# Patient Record
Sex: Female | Born: 1945 | Race: White | Hispanic: No | Marital: Married | Smoking: Never smoker
Health system: Southern US, Community
[De-identification: ages and names within clinical notes are randomized; demographics above are authoritative.]

## PROBLEM LIST (undated history)

## (undated) DIAGNOSIS — E7800 Pure hypercholesterolemia, unspecified: Secondary | ICD-10-CM

## (undated) DIAGNOSIS — R011 Cardiac murmur, unspecified: Secondary | ICD-10-CM

## (undated) DIAGNOSIS — I4891 Unspecified atrial fibrillation: Secondary | ICD-10-CM

## (undated) DIAGNOSIS — E079 Disorder of thyroid, unspecified: Secondary | ICD-10-CM

## (undated) DIAGNOSIS — I499 Cardiac arrhythmia, unspecified: Secondary | ICD-10-CM

## (undated) DIAGNOSIS — C50919 Malignant neoplasm of unspecified site of unspecified female breast: Secondary | ICD-10-CM

## (undated) DIAGNOSIS — I10 Essential (primary) hypertension: Secondary | ICD-10-CM

## (undated) HISTORY — DX: Cardiac arrhythmia, unspecified: I49.9

## (undated) HISTORY — PX: BUNIONECTOMY: SHX129

## (undated) HISTORY — DX: Cardiac murmur, unspecified: R01.1

## (undated) HISTORY — PX: BREAST SURGERY: SHX581

## (undated) HISTORY — DX: Disorder of thyroid, unspecified: E07.9

## (undated) HISTORY — PX: BACK SURGERY: SHX140

## (undated) HISTORY — PX: FRACTURE SURGERY: SHX138

---

## 2021-03-31 DIAGNOSIS — I350 Nonrheumatic aortic (valve) stenosis: Secondary | ICD-10-CM | POA: Diagnosis not present

## 2021-03-31 DIAGNOSIS — E039 Hypothyroidism, unspecified: Secondary | ICD-10-CM | POA: Diagnosis not present

## 2021-03-31 DIAGNOSIS — E038 Other specified hypothyroidism: Secondary | ICD-10-CM | POA: Diagnosis not present

## 2021-03-31 DIAGNOSIS — Z23 Encounter for immunization: Secondary | ICD-10-CM | POA: Diagnosis not present

## 2021-03-31 DIAGNOSIS — I10 Essential (primary) hypertension: Secondary | ICD-10-CM | POA: Diagnosis not present

## 2021-03-31 DIAGNOSIS — E785 Hyperlipidemia, unspecified: Secondary | ICD-10-CM | POA: Diagnosis not present

## 2021-03-31 DIAGNOSIS — E063 Autoimmune thyroiditis: Secondary | ICD-10-CM | POA: Diagnosis not present

## 2021-03-31 DIAGNOSIS — K635 Polyp of colon: Secondary | ICD-10-CM | POA: Diagnosis not present

## 2021-03-31 DIAGNOSIS — E7800 Pure hypercholesterolemia, unspecified: Secondary | ICD-10-CM | POA: Diagnosis not present

## 2021-03-31 DIAGNOSIS — Z0000 Encounter for general adult medical examination without abnormal findings: Secondary | ICD-10-CM | POA: Diagnosis not present

## 2021-03-31 DIAGNOSIS — Z79899 Other long term (current) drug therapy: Secondary | ICD-10-CM | POA: Diagnosis not present

## 2021-03-31 DIAGNOSIS — C50911 Malignant neoplasm of unspecified site of right female breast: Secondary | ICD-10-CM | POA: Diagnosis not present

## 2021-05-08 DIAGNOSIS — R590 Localized enlarged lymph nodes: Secondary | ICD-10-CM | POA: Diagnosis not present

## 2021-06-17 DIAGNOSIS — I10 Essential (primary) hypertension: Secondary | ICD-10-CM | POA: Diagnosis not present

## 2021-06-17 DIAGNOSIS — C50412 Malignant neoplasm of upper-outer quadrant of left female breast: Secondary | ICD-10-CM | POA: Diagnosis not present

## 2021-06-17 DIAGNOSIS — Z170 Estrogen receptor positive status [ER+]: Secondary | ICD-10-CM | POA: Diagnosis not present

## 2021-07-06 DIAGNOSIS — Z7689 Persons encountering health services in other specified circumstances: Secondary | ICD-10-CM | POA: Diagnosis not present

## 2021-07-06 DIAGNOSIS — Z780 Asymptomatic menopausal state: Secondary | ICD-10-CM | POA: Diagnosis not present

## 2021-07-06 DIAGNOSIS — I10 Essential (primary) hypertension: Secondary | ICD-10-CM | POA: Diagnosis not present

## 2021-07-06 DIAGNOSIS — Z86000 Personal history of in-situ neoplasm of breast: Secondary | ICD-10-CM | POA: Diagnosis not present

## 2021-07-06 DIAGNOSIS — I350 Nonrheumatic aortic (valve) stenosis: Secondary | ICD-10-CM | POA: Diagnosis not present

## 2021-07-06 DIAGNOSIS — E039 Hypothyroidism, unspecified: Secondary | ICD-10-CM | POA: Diagnosis not present

## 2021-07-06 DIAGNOSIS — Z0000 Encounter for general adult medical examination without abnormal findings: Secondary | ICD-10-CM | POA: Diagnosis not present

## 2021-08-15 ENCOUNTER — Emergency Department
Admission: EM | Admit: 2021-08-15 | Discharge: 2021-08-15 | Disposition: A | Discharge status: Home / Self Care | Payer: Medicare HMO | Attending: Emergency Medicine | Admitting: Emergency Medicine

## 2021-08-15 ENCOUNTER — Encounter: Payer: Self-pay | Admitting: Intensive Care

## 2021-08-15 ENCOUNTER — Other Ambulatory Visit: Payer: Self-pay

## 2021-08-15 ENCOUNTER — Emergency Department: Payer: Medicare HMO

## 2021-08-15 DIAGNOSIS — M7989 Other specified soft tissue disorders: Secondary | ICD-10-CM | POA: Diagnosis not present

## 2021-08-15 DIAGNOSIS — W19XXXA Unspecified fall, initial encounter: Secondary | ICD-10-CM | POA: Insufficient documentation

## 2021-08-15 DIAGNOSIS — S6992XA Unspecified injury of left wrist, hand and finger(s), initial encounter: Secondary | ICD-10-CM | POA: Diagnosis present

## 2021-08-15 DIAGNOSIS — M25561 Pain in right knee: Secondary | ICD-10-CM | POA: Insufficient documentation

## 2021-08-15 DIAGNOSIS — S63502A Unspecified sprain of left wrist, initial encounter: Secondary | ICD-10-CM | POA: Diagnosis not present

## 2021-08-15 DIAGNOSIS — M1711 Unilateral primary osteoarthritis, right knee: Secondary | ICD-10-CM | POA: Diagnosis not present

## 2021-08-15 DIAGNOSIS — S8991XA Unspecified injury of right lower leg, initial encounter: Secondary | ICD-10-CM | POA: Diagnosis not present

## 2021-08-15 DIAGNOSIS — I10 Essential (primary) hypertension: Secondary | ICD-10-CM | POA: Insufficient documentation

## 2021-08-15 HISTORY — DX: Essential (primary) hypertension: I10

## 2021-08-15 HISTORY — DX: Malignant neoplasm of unspecified site of unspecified female breast: C50.919

## 2021-08-15 HISTORY — DX: Pure hypercholesterolemia, unspecified: E78.00

## 2021-08-15 MED ORDER — ACETAMINOPHEN 500 MG PO TABS
1000.0000 mg | ORAL_TABLET | Freq: Once | ORAL | Status: AC
  Administered 2021-08-15: 1000 mg via ORAL
  Filled 2021-08-15: qty 2

## 2021-08-15 MED ORDER — NAPROXEN 500 MG PO TABS
500.0000 mg | ORAL_TABLET | Freq: Once | ORAL | Status: AC
  Administered 2021-08-15: 500 mg via ORAL
  Filled 2021-08-15: qty 1

## 2021-08-15 NOTE — Discharge Instructions (Signed)
Use Tylenol for pain and fevers.  Up to 1000 mg per dose, up to 4 times per day.  Do not take more than 4000 mg of Tylenol/acetaminophen within 24 hours..  Use naproxen/Aleve sparingly.  Ice, elevation and wrist splint

## 2021-08-15 NOTE — ED Provider Notes (Signed)
[facility] Emergency Department Provider Note ____________________________________________   Event Date/Time   First MD Initiated Contact with Patient 08/15/21 1413     (approximate)  I have reviewed the triage vital signs and the nursing notes.  HISTORY  Chief Complaint Wrist Pain, Knee Pain, and Fall   HPI Leslie Lewis is a 75 y.o. femalewho presents to the ED for evaluation of fall, left wrist pain.   Chart review indicates minimal medical history. She is independently ambulatory and lives at home with her husband.  Patient presents to the ED, accompanied by her husband, for evaluation of left wrist pain and right knee pain after a mechanical fall that occurred earlier today.  She reports accidentally getting her foot caught and her purse while exiting the car today, causing her to fall forward and strike her right knee and [place] on her left hand/wrist.  No head trauma or syncope.  She has been ambulatory since the event.  She was able to get back up and ambulate, and initially went home before returning to the ED due to increasing pain, primarily to her left wrist.  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  Past Medical History:  Diagnosis Date   Breast cancer ([place])    High cholesterol    Hypertension     There are no problems to display for this patient.   Past Surgical History:  Procedure Laterality Date   BREAST SURGERY      Prior to Admission medications   Not on File    Allergies Patient has no known allergies.  History reviewed. No pertinent family history.  Social History Social History   Tobacco Use   Smoking status: Never   Smokeless tobacco: Never  Substance Use Topics   Alcohol use: Yes    Alcohol/week: 3.0 standard drinks    Types: 3 Glasses of wine per week   Drug use: Never    Review of Systems  Constitutional: No fever/chills Eyes: No visual changes. ENT:  No sore throat. Cardiovascular: Denies chest pain. Respiratory: Denies shortness of breath. Gastrointestinal: No abdominal pain.  No nausea, no vomiting.  No diarrhea.  No constipation. Genitourinary: Negative for dysuria. Musculoskeletal: Negative for back pain. Positive for mechanical fall causing left wrist and right knee pain. Skin: Negative for rash. Neurological: Negative for headaches, focal weakness or numbness.  ____________________________________________   PHYSICAL EXAM:  VITAL SIGNS: Vitals:   08/15/21 1359  BP: (!) 160/123  Pulse: 64  Resp: 18  Temp: 98 F (36.7 C)  SpO2: 96%     Constitutional: Alert and oriented. Well appearing and in no acute distress. Eyes: Conjunctivae are normal. PERRL. EOMI. Head: Atraumatic. Nose: No congestion/rhinnorhea. Mouth/Throat: Mucous membranes are moist.  Oropharynx non-erythematous. Neck: No stridor. No cervical spine tenderness to palpation. Cardiovascular: Normal rate, regular rhythm. Grossly normal heart sounds.  Good peripheral circulation. Respiratory: Normal respiratory effort.  No retractions. Lungs CTAB. Gastrointestinal: Soft , nondistended, nontender to palpation. No CVA tenderness. Musculoskeletal:  No joint effusions. Small superficial abrasion to the anterior right knee overlying the patella.  No bony step-offs.  Full active and passive ROM with minimal discomfort.  No evidence of discontinuity.  Right leg is distally neurovascularly intact. No signs of deformity to the left wrist and left hand is distally neurovascularly intact.  Mild tenderness to the ulnar aspect of the left wrist, and she reports pain with ulnar deviation.  Nontender distal radius laterally. Neurologic:  Normal speech and language. No gross focal neurologic deficits are appreciated. No gait instability noted. Cranial nerves II through XII intact 5/5 strength and sensation in all 4 extremities Skin:  Skin is warm, dry and intact. No rash  noted. Psychiatric: Mood and affect are normal. Speech and behavior are normal.  ____________________________________________   LABS (all labs ordered are listed, but only abnormal results are displayed)  Labs Reviewed - No data to display ____________________________________________  12 Lead EKG   ____________________________________________  RADIOLOGY  ED MD interpretation: Plain films of the left wrist and right knee reviewed by me without evidence of acute fracture or dislocation  Official radiology report(s): DG Wrist Complete Left  Result Date: 08/15/2021 CLINICAL DATA:  Trauma, pain EXAM: LEFT WRIST - COMPLETE 3+ VIEW COMPARISON:  None. FINDINGS: No recent displaced fracture or dislocation is seen. There is minimal cortical irregularity in the lateral margin of distal radius without definite break in the cortical margins. There is soft tissue swelling over the dorsum of the wrist. Degenerative changes are noted with bony spurs in the first carpometacarpal joint. Small bony spurs seen in the intercarpal joints along the lateral aspect of the wrist. IMPRESSION: No recent displaced fracture or dislocation is seen. Minimal cortical irregularity in the lateral margin of distal radius may be residual from previous injury. Degenerative changes are noted with bony spurs in first carpometacarpal joint and the intercarpal joint along the lateral aspect of the wrist. Electronically Signed   By: Elmer Picker M.D.   On: 08/15/2021 15:04   DG Knee Complete 4 Views Right  Result Date: 08/15/2021 CLINICAL DATA:  Trauma, pain EXAM: RIGHT KNEE - COMPLETE 4+ VIEW COMPARISON:  None. FINDINGS: No recent fracture or dislocation is seen. Small bony spurs seen in the patella and medial compartment. There is possible minimal effusion in the suprapatellar bursa. IMPRESSION: No recent fracture or dislocation is seen in the right knee. Possible minimal effusion is present in the suprapatellar bursa.  There are degenerative changes tiny bony spurs in the medial and patellofemoral compartments. Electronically Signed   By: Elmer Picker M.D.   On: 08/15/2021 15:06    ____________________________________________   PROCEDURES and INTERVENTIONS  Procedure(s) performed (including Critical Care):  Procedures  Medications  acetaminophen (TYLENOL) tablet 1,000 mg (1,000 mg Oral Given 08/15/21 1548)  naproxen (NAPROSYN) tablet 500 mg (500 mg Oral Given 08/15/21 1549)    ____________________________________________   MDM / ED COURSE   Pleasant 75 year old lady presents to the ED after mechanical fall with wrist pain, likely a strain/sprain and amenable to outpatient management.  She looks clinically well without evidence of neurologic or vascular deficits.  No signs of open injuries, laceration or bleeding injuries.  Abrasions to the right knee with low suspicion for fracture, and follow-up x-rays are without evidence of fracture or dislocation.  She has some tenderness with ulnar deviation of that left wrist, but no point bony tenderness.  X-ray questions cortical irregularity to the distal radius, but does not correlate clinically.  Anticipate soft tissue strain.  We discussed bracing at home, she reports having a splint from her remote de Quervain's tenosynovitis surgery, and return precautions for the ED.  Clinical Course as of 08/15/21 1549  Sat Aug 15, 2021  1520 Reassessed and reexamined.  We discussed x-ray results.  No tenderness to the distal radius and she is most tender with ulnar deviation of the wrist.  We discussed likely sprain/strain and management at home, and return precautions for the ED. [  DS]    Clinical Course User Index [DS] Vladimir Crofts, MD    ____________________________________________   FINAL CLINICAL IMPRESSION(S) / ED DIAGNOSES  Final diagnoses:  Fall, initial encounter  Sprain of left wrist, initial encounter     ED Discharge Orders     None         Surabhi Gadea Tamala Julian   Note:  This document was prepared using Dragon voice recognition software and may include unintentional dictation errors.    Vladimir Crofts, MD 08/15/21 [phone]

## 2021-08-15 NOTE — ED Triage Notes (Signed)
Patient tripped over purse getting out of car. C/o pain in right knee and left wrist

## 2021-08-15 NOTE — ED Notes (Signed)
Patient declined discharge vital signs. 

## 2022-09-08 ENCOUNTER — Encounter: Payer: Self-pay | Admitting: Emergency Medicine

## 2022-09-08 ENCOUNTER — Other Ambulatory Visit: Payer: Self-pay

## 2022-09-08 ENCOUNTER — Emergency Department: Payer: Medicare Other

## 2022-09-08 ENCOUNTER — Emergency Department
Admission: EM | Admit: 2022-09-08 | Discharge: 2022-09-08 | Disposition: A | Discharge status: Home / Self Care | Payer: Medicare Other | Attending: Emergency Medicine | Admitting: Emergency Medicine

## 2022-09-08 DIAGNOSIS — I4891 Unspecified atrial fibrillation: Secondary | ICD-10-CM | POA: Diagnosis not present

## 2022-09-08 DIAGNOSIS — Z853 Personal history of malignant neoplasm of breast: Secondary | ICD-10-CM | POA: Insufficient documentation

## 2022-09-08 DIAGNOSIS — R079 Chest pain, unspecified: Secondary | ICD-10-CM | POA: Diagnosis present

## 2022-09-08 DIAGNOSIS — R0609 Other forms of dyspnea: Secondary | ICD-10-CM | POA: Diagnosis not present

## 2022-09-08 DIAGNOSIS — I10 Essential (primary) hypertension: Secondary | ICD-10-CM | POA: Insufficient documentation

## 2022-09-08 LAB — CBC
HCT: 35.8 % — ABNORMAL LOW (ref 36.0–46.0)
Hemoglobin: 11.6 g/dL — ABNORMAL LOW (ref 12.0–15.0)
MCH: 30.5 pg (ref 26.0–34.0)
MCHC: 32.4 g/dL (ref 30.0–36.0)
MCV: 94.2 fL (ref 80.0–100.0)
Platelets: 152 10*3/uL (ref 150–400)
RBC: 3.8 MIL/uL — ABNORMAL LOW (ref 3.87–5.11)
RDW: 13.4 % (ref 11.5–15.5)
WBC: 5.6 10*3/uL (ref 4.0–10.5)
nRBC: 0 % (ref 0.0–0.2)

## 2022-09-08 LAB — BASIC METABOLIC PANEL
Anion gap: 9 (ref 5–15)
BUN: 20 mg/dL (ref 8–23)
CO2: 24 mmol/L (ref 22–32)
Calcium: 8.5 mg/dL — ABNORMAL LOW (ref 8.9–10.3)
Chloride: 108 mmol/L (ref 98–111)
Creatinine, Ser: 0.93 mg/dL (ref 0.44–1.00)
GFR, Estimated: >60 mL/min (ref >60)
Glucose, Bld: 100 mg/dL — ABNORMAL HIGH (ref 70–99)
Potassium: 3.9 mmol/L (ref 3.5–5.1)
Sodium: 141 mmol/L (ref 135–145)

## 2022-09-08 LAB — MAGNESIUM: Magnesium: 2.1 mg/dL (ref 1.7–2.4)

## 2022-09-08 LAB — TROPONIN I (HIGH SENSITIVITY): Troponin I (High Sensitivity): 7 ng/L (ref <18)

## 2022-09-08 LAB — TSH: TSH: 5.383 u[IU]/mL — ABNORMAL HIGH (ref 0.350–4.500)

## 2022-09-08 MED ORDER — METOPROLOL TARTRATE 25 MG PO TABS
25.0000 mg | ORAL_TABLET | Freq: Once | ORAL | Status: AC
  Administered 2022-09-08: 25 mg via ORAL
  Filled 2022-09-08: qty 1

## 2022-09-08 MED ORDER — METOPROLOL TARTRATE 25 MG PO TABS: 25.0000 mg | ORAL_TABLET | Freq: Two times a day (BID) | ORAL | 2 refills | Status: DC

## 2022-09-08 MED ORDER — APIXABAN 5 MG PO TABS: 5.0000 mg | ORAL_TABLET | Freq: Two times a day (BID) | ORAL | 0 refills | Status: AC

## 2022-09-08 NOTE — ED Provider Notes (Signed)
[facility] Provider Note    Event Date/Time   First MD Initiated Contact with Patient 09/08/22 1936     (approximate)   History   Chief Complaint Chest Pain   HPI  Bryleigh Ottaway is a 76 y.o. female with past medical history of hypertension, hyperlipidemia, and breast cancer who presents to the ED complaining of chest pain.  Patient ports that about 1 week ago she first began to feel like her heart was racing with some pressure in her chest and difficulty breathing.  This seemed to improve but remained present over the course of the past week, while she traveled to [place] to visit family.  She states while traveling home, she would get out of breath very easily and has felt very fatigued, describes ongoing chest pressure that has been milder than before.  She reports a mild difficulty breathing at rest, denies any fevers or cough.  She has not had any pain or swelling in her legs.  EMS notes atrial fibrillation with RVR, patient denies any history of this.     Physical Exam   Triage Vital Signs: ED Triage Vitals [09/08/22 1937]  Enc Vitals Group     BP      Pulse      Resp      Temp      Temp src      SpO2      Weight 232 lb (105.2 kg)     Height '5\' 8"'$  (1.727 m)     Head Circumference      Peak Flow      Pain Score      Pain Loc      Pain Edu?      Excl. in [place]?     Most recent vital signs: Vitals:   09/08/22 2000 09/08/22 2030  BP: (!) 144/83 (!) 144/84  Pulse: 83 84  Resp: 17 15  Temp:    SpO2: 95% 94%    Constitutional: Alert and oriented. Eyes: Conjunctivae are normal. Head: Atraumatic. Nose: No congestion/rhinnorhea. Mouth/Throat: Mucous membranes are moist.  Cardiovascular: Tachycardic, irregularly irregular rhythm. Grossly normal heart sounds.  2+ radial pulses bilaterally. Respiratory: Normal respiratory effort.  No retractions. Lungs CTAB. Gastrointestinal: Soft and nontender. No distention. Musculoskeletal: No lower  extremity tenderness nor edema.  Neurologic:  Normal speech and language. No gross focal neurologic deficits are appreciated.    ED Results / Procedures / Treatments   Labs (all labs ordered are listed, but only abnormal results are displayed) Labs Reviewed  BASIC METABOLIC PANEL - Abnormal; Notable for the following components:      Result Value   Glucose, Bld 100 (*)    Calcium 8.5 (*)    All other components within normal limits  CBC - Abnormal; Notable for the following components:   RBC 3.80 (*)    Hemoglobin 11.6 (*)    HCT 35.8 (*)    All other components within normal limits  TSH - Abnormal; Notable for the following components:   TSH 5.383 (*)    All other components within normal limits  MAGNESIUM  TROPONIN I (HIGH SENSITIVITY)  TROPONIN I (HIGH SENSITIVITY)     EKG  ED ECG REPORT I, Blake Divine, the attending physician, personally viewed and interpreted this ECG.   Date: 09/08/2022  EKG Time: 19:35  Rate: 69  Rhythm: atrial fibrillation  Axis: Normal  Intervals:none  ST&T Change: None  RADIOLOGY Chest x-ray reviewed and interpreted by me with  no infiltrate, edema, or effusion.  PROCEDURES:  Critical Care performed: No  Procedures   MEDICATIONS ORDERED IN ED: Medications  metoprolol tartrate (LOPRESSOR) tablet 25 mg (has no administration in time range)     IMPRESSION / MDM / ASSESSMENT AND PLAN / ED COURSE  I reviewed the triage vital signs and the nursing notes.                              76 y.o. female with past medical history of hypertension, hyperlipidemia, and breast cancer who presents to the ED complaining of palpitations, chest pressure, and shortness of breath primarily with exertion for the past week.  Patient's presentation is most consistent with acute presentation with potential threat to life or bodily function.  Differential diagnosis includes, but is not limited to, arrhythmia, ACS, PE, pneumonia, CHF, electrolyte  abnormality, AKI, anemia.  Patient nontoxic-appearing and in no acute distress, vital signs are unremarkable.  Patient noted to be in atrial fibrillation with EMS, who reports that heart rate got as high as 150 but now improved following 10 mg of IV diltiazem.  EKG here in the ED shows atrial fibrillation with controlled rate, no ischemic changes noted.  We will screen labs and chest x-ray, observe on cardiac monitor.  Labs are reassuring with no significant anemia, leukocytosis, stridor abnormality, or AKI.  Troponin within normal limits and I doubt ACS or PE.  Heart rate remains well-controlled here in the ED and patient asymptomatic on reassessment.  Case discussed with Dr. Clayborn Bigness of cardiology, who agrees with plan for discharge home with outpatient follow-up.  We will start patient on Eliquis given her elevated CHA2DS2-VASc score, will also start on 25 mg metoprolol twice daily.  She was counseled to return to the ED for new or worsening symptoms, patient agrees with plan.      FINAL CLINICAL IMPRESSION(S) / ED DIAGNOSES   Final diagnoses:  Atrial fibrillation, unspecified type ([place])  Dyspnea on exertion     Rx / DC Orders   ED Discharge Orders          Ordered    metoprolol tartrate (LOPRESSOR) 25 MG tablet  2 times daily        09/08/22 2116    apixaban (ELIQUIS) 5 MG TABS tablet  2 times daily        09/08/22 2116             Note:  This document was prepared using Dragon voice recognition software and may include unintentional dictation errors.   Blake Divine, MD 09/08/22 2117

## 2022-09-08 NOTE — ED Triage Notes (Signed)
Pt arrived via [facility] from home with c/o palpitations, central chest pressure and SOB x1 week. Pt alerted by smart watch that she was in AFib rhythm with no hx. Pt found to be in AFib RVR at a rate of 140-180 initial by EMS. Pt given '10mg'$  Cardizem. Post med, pt in Afib without RVR at a rate of 100-125bpm.

## 2022-09-08 NOTE — ED Notes (Signed)
ED Provider at bedside. 

## 2022-10-19 ENCOUNTER — Ambulatory Visit: Payer: Medicare Other | Admitting: Student in an Organized Health Care Education/Training Program

## 2023-04-05 ENCOUNTER — Other Ambulatory Visit: Payer: Self-pay | Admitting: Orthopedic Surgery

## 2023-04-05 DIAGNOSIS — M84461A Pathological fracture, right tibia, initial encounter for fracture: Secondary | ICD-10-CM

## 2023-04-05 DIAGNOSIS — M25561 Pain in right knee: Secondary | ICD-10-CM

## 2023-04-07 ENCOUNTER — Ambulatory Visit
Admission: RE | Admit: 2023-04-07 | Discharge: 2023-04-07 | Disposition: A | Discharge status: Home / Self Care | Payer: Medicare Other | Source: Ambulatory Visit | Attending: Orthopedic Surgery | Admitting: Orthopedic Surgery

## 2023-04-07 DIAGNOSIS — M25561 Pain in right knee: Secondary | ICD-10-CM | POA: Insufficient documentation

## 2023-04-07 DIAGNOSIS — M84461A Pathological fracture, right tibia, initial encounter for fracture: Secondary | ICD-10-CM | POA: Diagnosis present

## 2023-06-22 ENCOUNTER — Emergency Department: Payer: Medicare Other

## 2023-06-22 ENCOUNTER — Emergency Department
Admission: EM | Admit: 2023-06-22 | Discharge: 2023-06-22 | Disposition: A | Discharge status: Home / Self Care | Payer: Medicare Other | Attending: Emergency Medicine | Admitting: Emergency Medicine

## 2023-06-22 ENCOUNTER — Other Ambulatory Visit: Payer: Self-pay

## 2023-06-22 DIAGNOSIS — H81399 Other peripheral vertigo, unspecified ear: Secondary | ICD-10-CM | POA: Diagnosis not present

## 2023-06-22 DIAGNOSIS — R42 Dizziness and giddiness: Secondary | ICD-10-CM | POA: Diagnosis present

## 2023-06-22 DIAGNOSIS — I10 Essential (primary) hypertension: Secondary | ICD-10-CM | POA: Insufficient documentation

## 2023-06-22 LAB — BASIC METABOLIC PANEL
Anion gap: 10 (ref 5–15)
BUN: 24 mg/dL — ABNORMAL HIGH (ref 8–23)
CO2: 26 mmol/L (ref 22–32)
Calcium: 9 mg/dL (ref 8.9–10.3)
Chloride: 97 mmol/L — ABNORMAL LOW (ref 98–111)
Creatinine, Ser: 0.91 mg/dL (ref 0.44–1.00)
GFR, Estimated: >60 mL/min (ref >60)
Glucose, Bld: 178 mg/dL — ABNORMAL HIGH (ref 70–99)
Potassium: 4.1 mmol/L (ref 3.5–5.1)
Sodium: 133 mmol/L — ABNORMAL LOW (ref 135–145)

## 2023-06-22 LAB — CBC
HCT: 35.9 % — ABNORMAL LOW (ref 36.0–46.0)
Hemoglobin: 12.1 g/dL (ref 12.0–15.0)
MCH: 30.9 pg (ref 26.0–34.0)
MCHC: 33.7 g/dL (ref 30.0–36.0)
MCV: 91.6 fL (ref 80.0–100.0)
Platelets: 176 10*3/uL (ref 150–400)
RBC: 3.92 MIL/uL (ref 3.87–5.11)
RDW: 13 % (ref 11.5–15.5)
WBC: 5.5 10*3/uL (ref 4.0–10.5)
nRBC: 0 % (ref 0.0–0.2)

## 2023-06-22 MED ORDER — ONDANSETRON 4 MG PO TBDP: 4.0000 mg | ORAL_TABLET | Freq: Four times a day (QID) | ORAL | 0 refills | Status: DC | PRN

## 2023-06-22 MED ORDER — MECLIZINE HCL 12.5 MG PO TABS: 12.5000 mg | ORAL_TABLET | Freq: Three times a day (TID) | ORAL | 1 refills | Status: AC | PRN

## 2023-06-22 MED ORDER — ONDANSETRON HCL 4 MG/2ML IJ SOLN
4.0000 mg | INTRAMUSCULAR | Status: AC
  Administered 2023-06-22: 4 mg via INTRAVENOUS
  Filled 2023-06-22: qty 2

## 2023-06-22 MED ORDER — LORAZEPAM 2 MG/ML IJ SOLN
0.5000 mg | Freq: Once | INTRAMUSCULAR | Status: AC
  Administered 2023-06-22: 0.5 mg via INTRAVENOUS
  Filled 2023-06-22: qty 1

## 2023-06-22 MED ORDER — MECLIZINE HCL 25 MG PO TABS
12.5000 mg | ORAL_TABLET | Freq: Once | ORAL | Status: AC
  Administered 2023-06-22: 12.5 mg via ORAL
  Filled 2023-06-22: qty 1

## 2023-06-22 NOTE — ED Provider Notes (Signed)
[facility] Provider Note    Event Date/Time   First MD Initiated Contact with Patient 06/22/23 [phone]     (approximate)   History   Dizziness and Nausea   HPI  Leslie Lewis is a 77 y.o. female with a history of hypertension and atrial fibrillation  Patient reports that she woke up at about 3 AM to use the bathroom.  When she got up she felt extremely dizzy.  It is improved if she sits still but developed severe dizziness nausea and vomiting if she moves or gets up too quickly.  She is supposed to go [place]  There is no headache no numbness no weakness.  She has vomited several times she reports she gets so dizzy feeling it makes her throw up.  No chest pain or trouble breathing.  Denies recent illness.  She has not had the symptoms before     Physical Exam   Triage Vital Signs: ED Triage Vitals  Encounter Vitals Group     BP 06/22/23 0756 (!) 135/51     Systolic BP Percentile --      Diastolic BP Percentile --      Pulse Rate 06/22/23 0756 74     Resp 06/22/23 0756 18     Temp 06/22/23 0756 97.6 F (36.4 C)     Temp Source 06/22/23 0756 Oral     SpO2 06/22/23 0756 95 %     Weight 06/22/23 0800 234 lb (106.1 kg)     Height 06/22/23 0800 5\' 8"  (1.727 m)     Head Circumference --      Peak Flow --      Pain Score 06/22/23 0800 0     Pain Loc --      Pain Education --      Exclude from Growth Chart --     Most recent vital signs: Vitals:   06/22/23 0756 06/22/23 0830  BP: (!) 135/51   Pulse: 74 65  Resp: 18 20  Temp: 97.6 F (36.4 C)   SpO2: 95% 94%     General: Awake, no distress.  CV:  Good peripheral perfusion.  Normal tones and rate, Resp:  Normal effort.  Clear bilateral Abd:  No distention.  Soft nontender Other:  Normocephalic atraumatic.  Moist mucous membranes.  Normal extraocular movements without nystagmus while at rest.  She demonstrates normal strength in all  extremities no motor deficits.  Cranial nerve exam normal.   ED Results / Procedures / Treatments   Labs (all labs ordered are listed, but only abnormal results are displayed) Labs Reviewed  CBC - Abnormal; Notable for the following components:      Result Value   HCT 35.9 (*)    All other components within normal limits  BASIC METABOLIC PANEL - Abnormal; Notable for the following components:   Sodium 133 (*)    Chloride 97 (*)    Glucose, Bld 178 (*)    BUN 24 (*)    All other components within normal limits     EKG  And interpreted by me at 810 heart rate 65 QRS 110 QTc 440 Normal sinus rhythm no evidence ischemia   RADIOLOGY  MR BRAIN WO CONTRAST  Result Date: 06/22/2023 CLINICAL DATA:  77 year old female with acute onset dizziness at 0300 hours. Atrial fibrillation. Nausea vomiting. EXAM: MRI HEAD WITHOUT CONTRAST TECHNIQUE: Multiplanar, multiecho pulse sequences of the brain and  surrounding structures were obtained without intravenous contrast. COMPARISON:  None Available. FINDINGS: Brain: Cerebral volume is within normal limits for age. No restricted diffusion to suggest acute infarction. No midline shift, mass effect, evidence of mass lesion, ventriculomegaly, extra-axial collection or acute intracranial hemorrhage. Cervicomedullary junction and pituitary are within normal limits. Widely scattered, generally small but sometimes patchy bilateral white matter T2 and FLAIR hyperintensity. Configuration is nonspecific. No cortical encephalomalacia identified. Rare chronic microhemorrhage in the brain (right anterior frontal lobe series 10, image 40). Deep gray nuclei, brainstem and cerebellum appear normal for age. Vascular: Major intracranial vascular flow voids are preserved. Some generalized intracranial artery tortuosity. Skull and upper cervical spine: Normal for age visible cervical spine. Visualized bone marrow signal is within normal limits. Sinuses/Orbits: Postoperative  changes to both globes, otherwise negative orbits. Moderate sphenoid sinus mucosal thickening. Minor paranasal sinus mucosal thickening elsewhere. Other: Mastoids are clear. Visible internal auditory structures appear normal. Stylomastoid foramina, visible scalp and face appear negative. IMPRESSION: 1. No acute intracranial abnormality. 2. Advanced but nonspecific cerebral white matter signal changes, most commonly due to chronic small vessel disease. Otherwise negative noncontrast MRI appearance of the brain. 3. Sphenoid sinusitis with no complicating features. Electronically Signed   By: Odessa Fleming M.D.   On: 06/22/2023 09:52      PROCEDURES:  Critical Care performed: No  Procedures   MEDICATIONS ORDERED IN ED: Medications  ondansetron (ZOFRAN) injection 4 mg (4 mg Intravenous Given 06/22/23 0820)  meclizine (ANTIVERT) tablet 12.5 mg (12.5 mg Oral Given 06/22/23 0821)  LORazepam (ATIVAN) injection 0.5 mg (0.5 mg Intravenous Given 06/22/23 0821)     IMPRESSION / MDM / ASSESSMENT AND PLAN / ED COURSE  I reviewed the triage vital signs and the nursing notes.                              Differential diagnosis includes, but is not limited to, possible vertigo, electrolyte abnormality, central cause such as stroke, etc.  He has no acute cardiopulmonary symptoms.  No syncopal symptoms or palpitations.  She denies any recent illness no fevers.  Vital signs reassuring  She appears to have examination consistent with severe vertiginous feeling with movement.  Alleviated by sitting still  She does have active dry heaving when moved between bed and stretcher.  This calms with holding her head still.  Symptoms are quite reproducible with movement  Patient's presentation is most consistent with acute complicated illness / injury requiring diagnostic workup.   The patient is on the cardiac monitor to evaluate for evidence of arrhythmia and/or significant heart rate  changes.   ----------------------------------------- 11:13 AM on 06/22/2023 ----------------------------------------- Patient fully alert.  Feeling much better.  Advises nausea and vomiting have improved and the dizziness seems to have calmed quite a bit.  She has a walker at the house that she can use, and is comfortable with a plan to try getting up with assistance here and if she is able to walk safely and steadily to go home with prescription for Zofran and Antivert.  She is presently alert well-oriented in no distress.  Symptoms appear much improved.  Suspect peripheral vertigo without evidence of central cause on MRI  No acute cardiopulmonary or vascular symptoms.  No neck pain.   ----------------------------------------- 11:48 AM on 06/22/2023 -----------------------------------------  Per rn Pt ambulated in hallway about 50' using walker. Pt in NAD, normal gait, no issues. Pt did say she may use husbands  walker at home, still feeling a little weak but much better than earlier. Dr. Fanny Bien to be informed.   Considered small IV fluid bolus but due to national shortage will withhold.  Patient able to tolerate by mouth now  Return precautions and treatment recommendations and follow-up discussed with the patient who is agreeable with the plan.   FINAL CLINICAL IMPRESSION(S) / ED DIAGNOSES   Final diagnoses:  Peripheral vertigo, unspecified laterality     Rx / DC Orders   ED Discharge Orders          Ordered    meclizine (ANTIVERT) 12.5 MG tablet  3 times daily PRN        06/22/23 1147    ondansetron (ZOFRAN-ODT) 4 MG disintegrating tablet  Every 6 hours PRN        06/22/23 1147             Note:  This document was prepared using Dragon voice recognition software and may include unintentional dictation errors.   Sharyn Creamer, MD 06/22/23 1148

## 2023-06-22 NOTE — ED Notes (Signed)
Pt ambulated in hallway about 50' using walker. Pt in NAD, normal gait, no issues. Pt did say she may use husbands walker at home, still feeling a little weak but much better than earlier. Dr. Fanny Bien to be informed.

## 2023-06-22 NOTE — Discharge Instructions (Addendum)
I recommend you do not drive until you are certain your symptoms have improved, as it could cause you to crash he suddenly got dizzy  Return to the ER right away if you develop difficulty speaking, weakness in arm or leg or your face, facial droop, severe headache fever neck pain feel you are going to pass out to have abdominal pain chest pain or other concerns arise

## 2023-06-22 NOTE — ED Triage Notes (Signed)
Pt to ED via [facility] from home for c/o n/v when moving and dizziness which began began at 3:00 am. Pt has hx Afib. Vital signs normal with EMS. Cbg 175.

## 2023-10-13 IMAGING — CR DG WRIST COMPLETE 3+V*L*
4 series · 4 of 4 positions shown · non-contrast
Comparison: None.

CLINICAL DATA: Trauma, pain

EXAM:
LEFT WRIST - COMPLETE 3+ VIEW

[wrist pa]
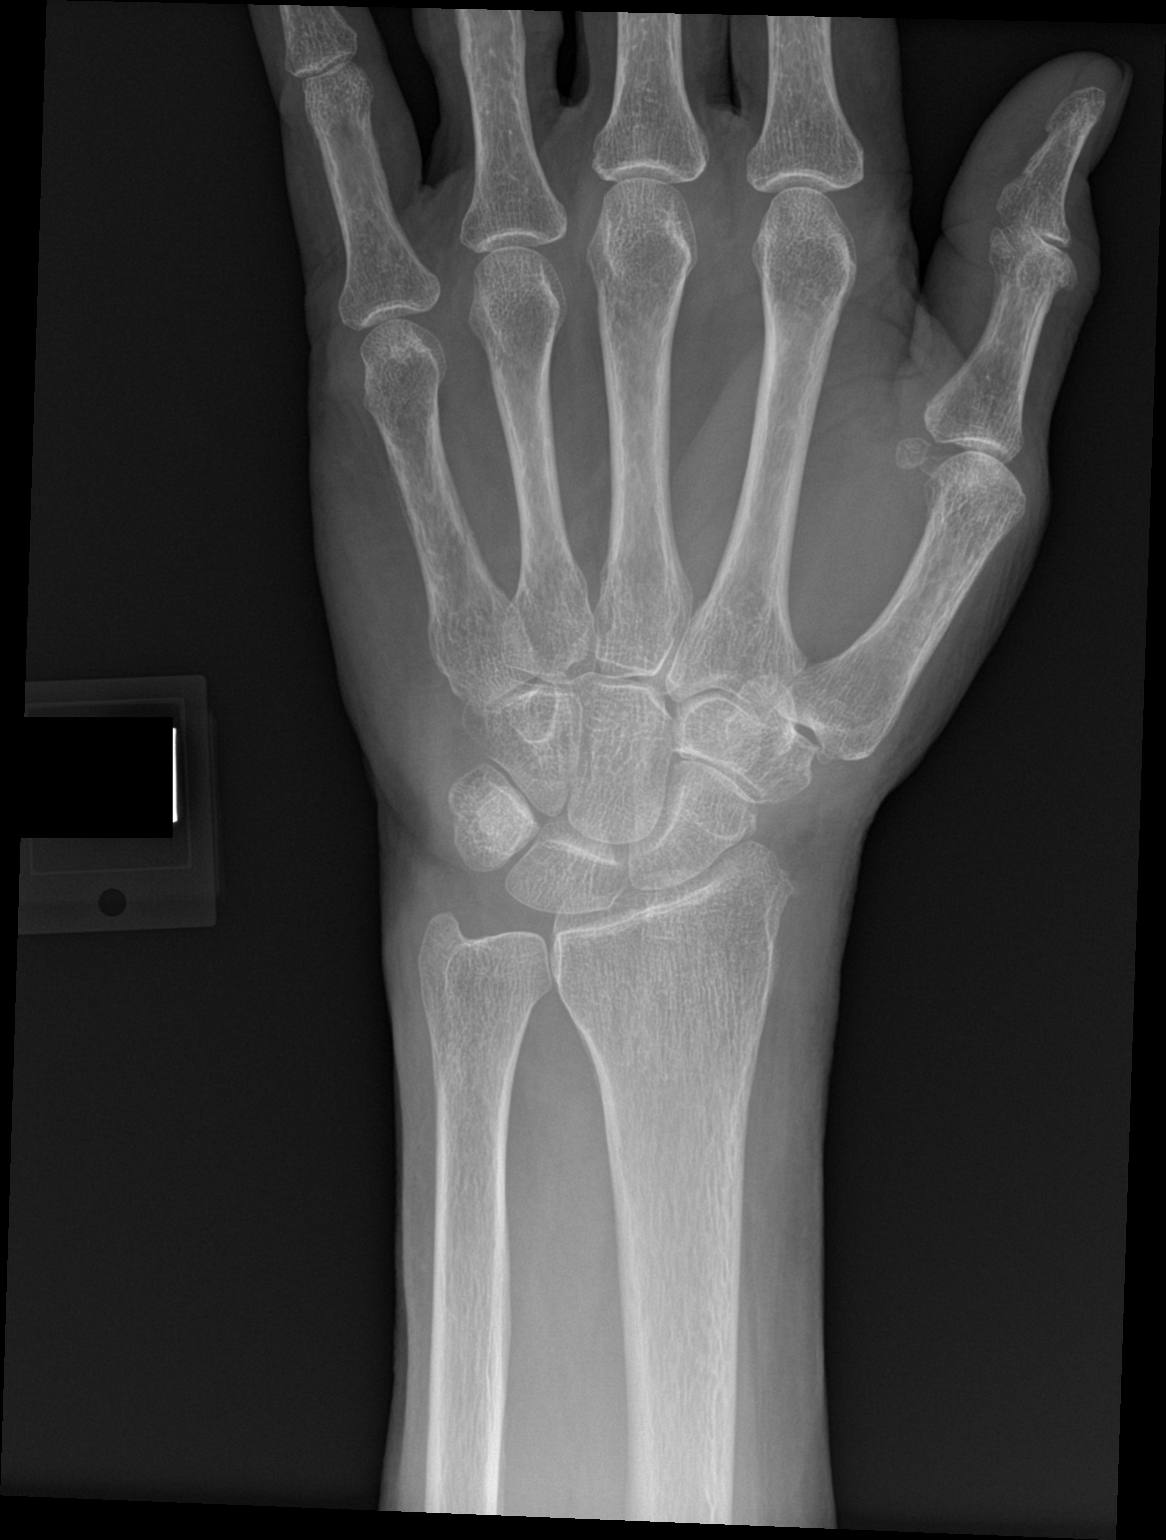

[wrist obl]
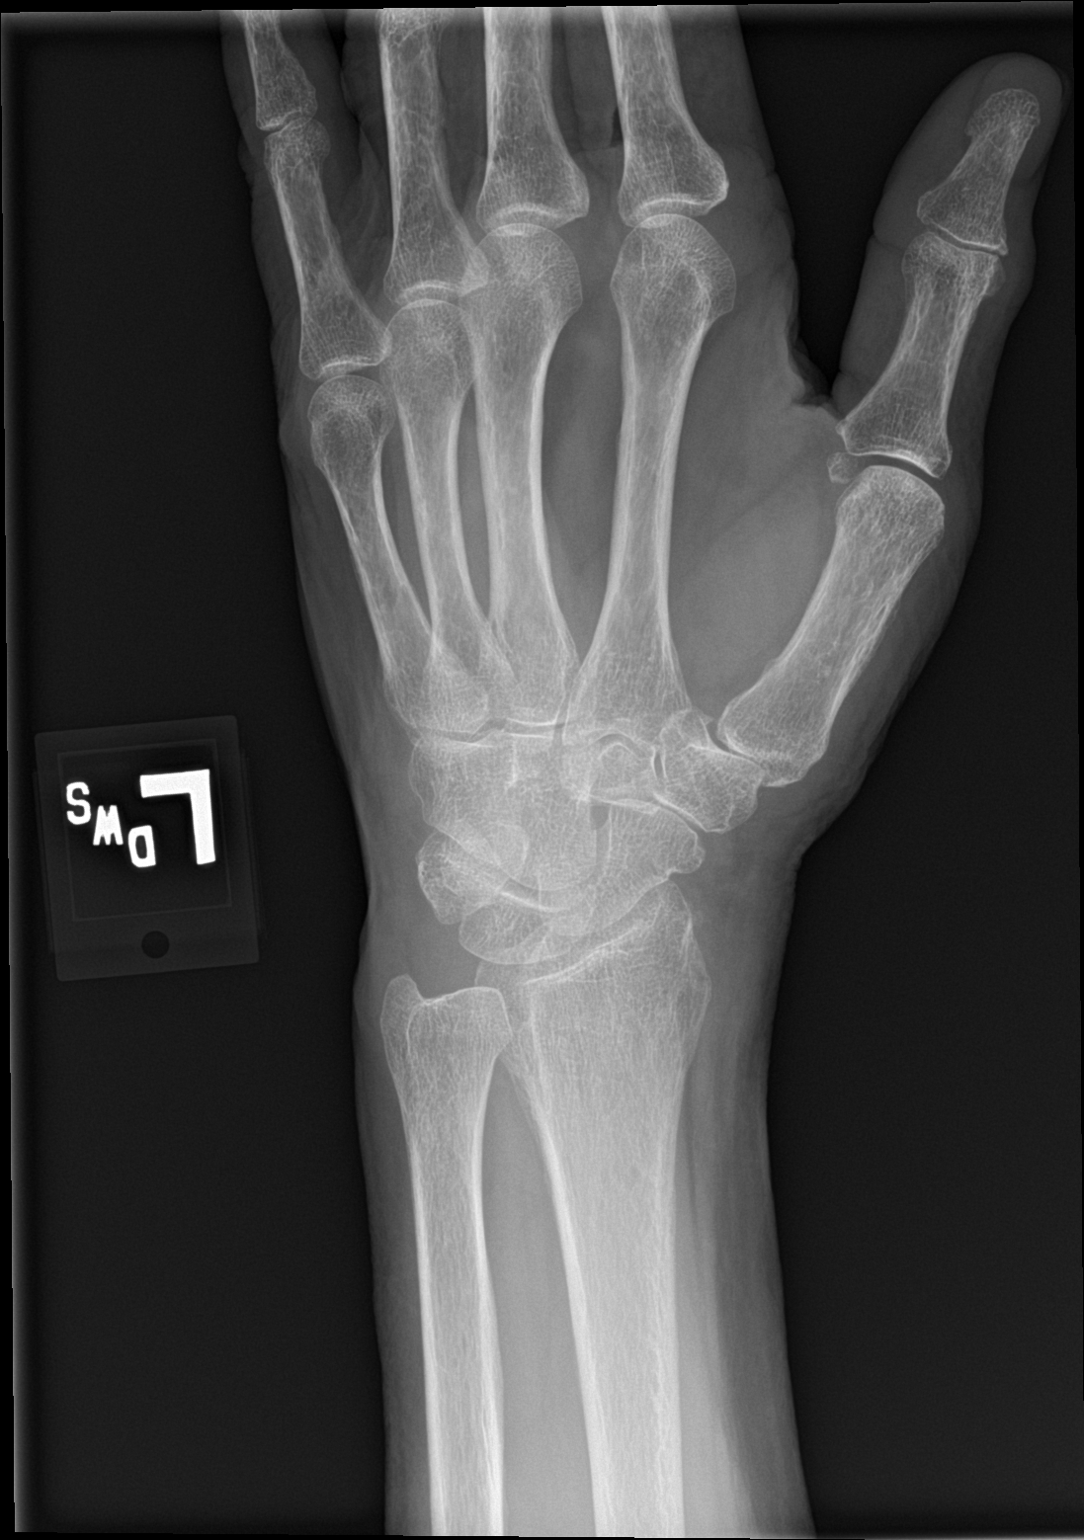

[wrist lat]
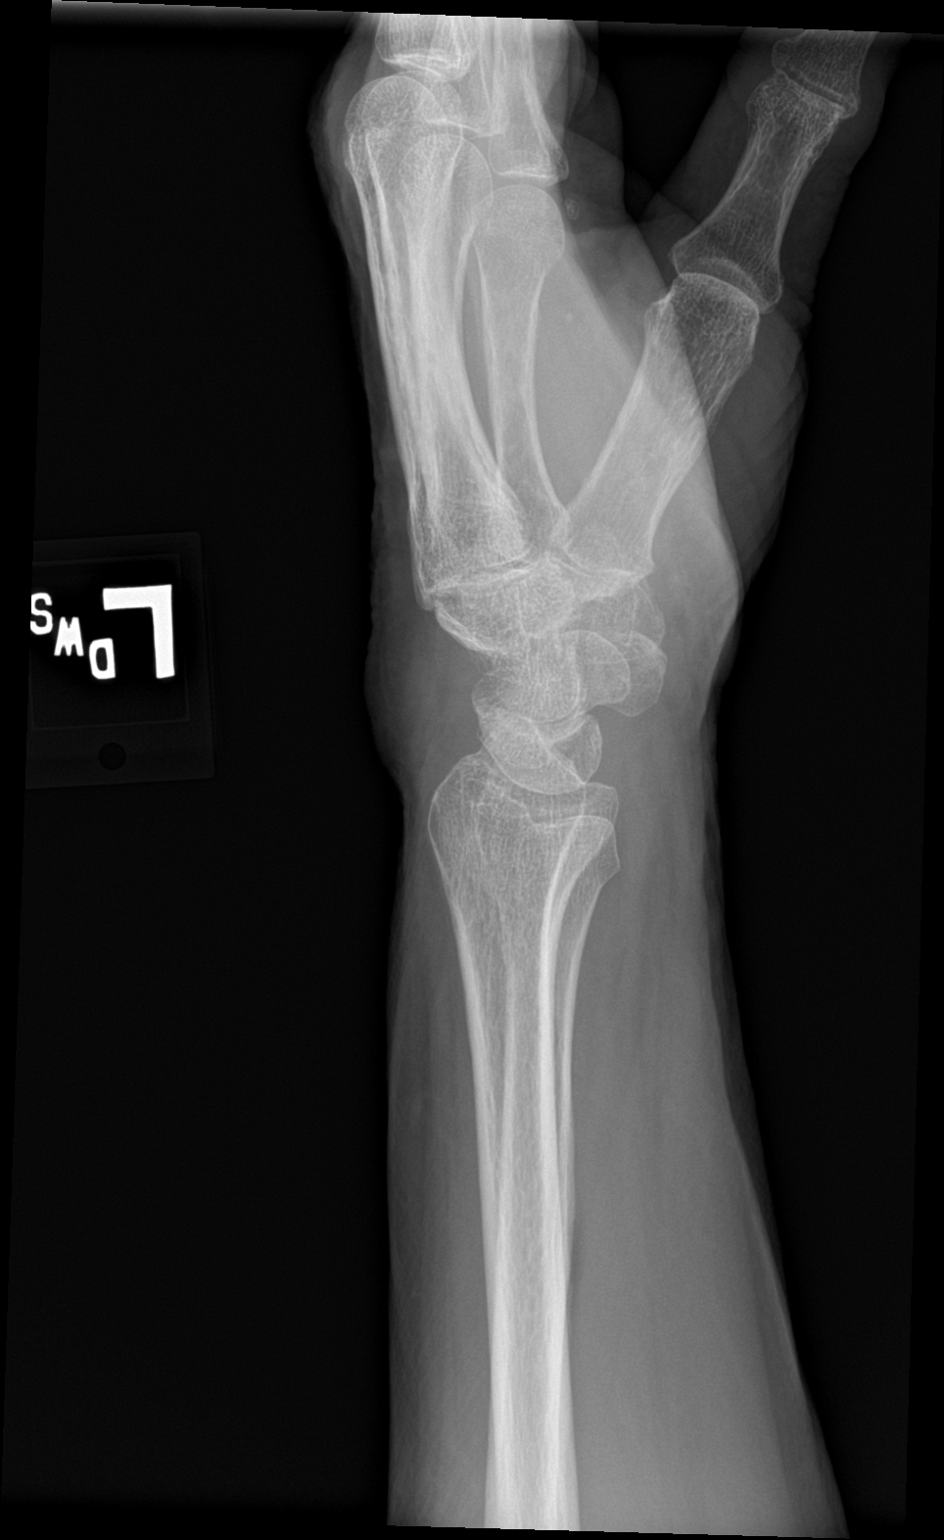

[navicular]
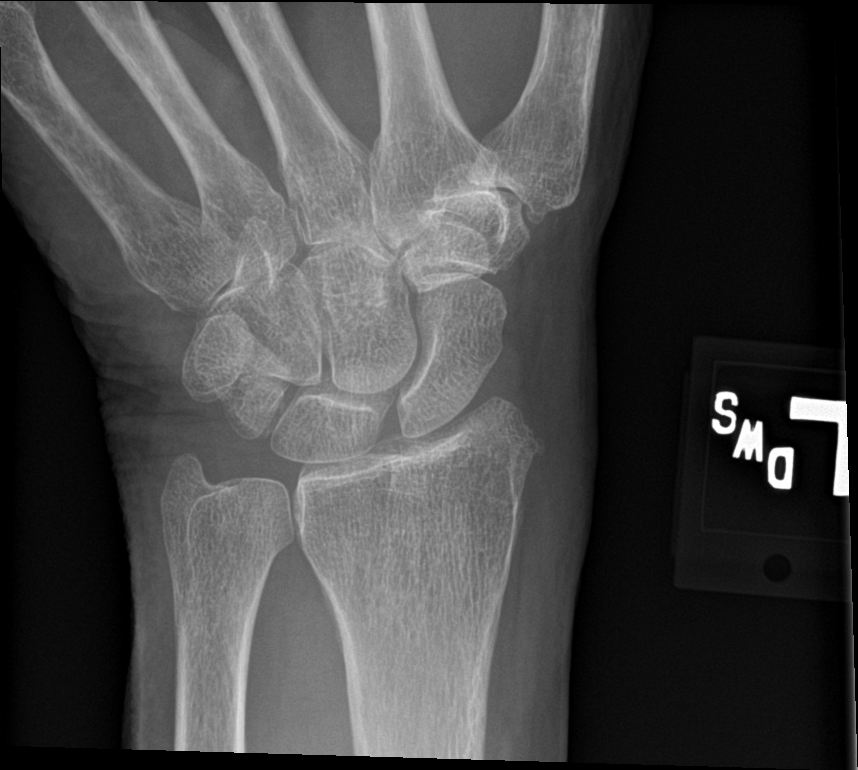

[4 of 4 positions shown; findings below may reference images not displayed]

FINDINGS: No recent displaced fracture or dislocation is seen. There is
minimal cortical irregularity in the lateral margin of distal radius
without definite break in the cortical margins. There is soft tissue
swelling over the dorsum of the wrist. Degenerative changes are
noted with bony spurs in the first carpometacarpal joint. Small bony
spurs seen in the intercarpal joints along the lateral aspect of the
wrist.
IMPRESSION: No recent displaced fracture or dislocation is seen. Minimal
cortical irregularity in the lateral margin of distal radius may be
residual from previous injury.

Degenerative changes are noted with bony spurs in first
carpometacarpal joint and the intercarpal joint along the lateral
aspect of the wrist.

## 2023-10-13 IMAGING — CR DG KNEE COMPLETE 4+V*R*
4 series · 4 of 4 positions shown · non-contrast
Comparison: None.

CLINICAL DATA: Trauma, pain

EXAM:
RIGHT KNEE - COMPLETE 4+ VIEW

[knee ap]
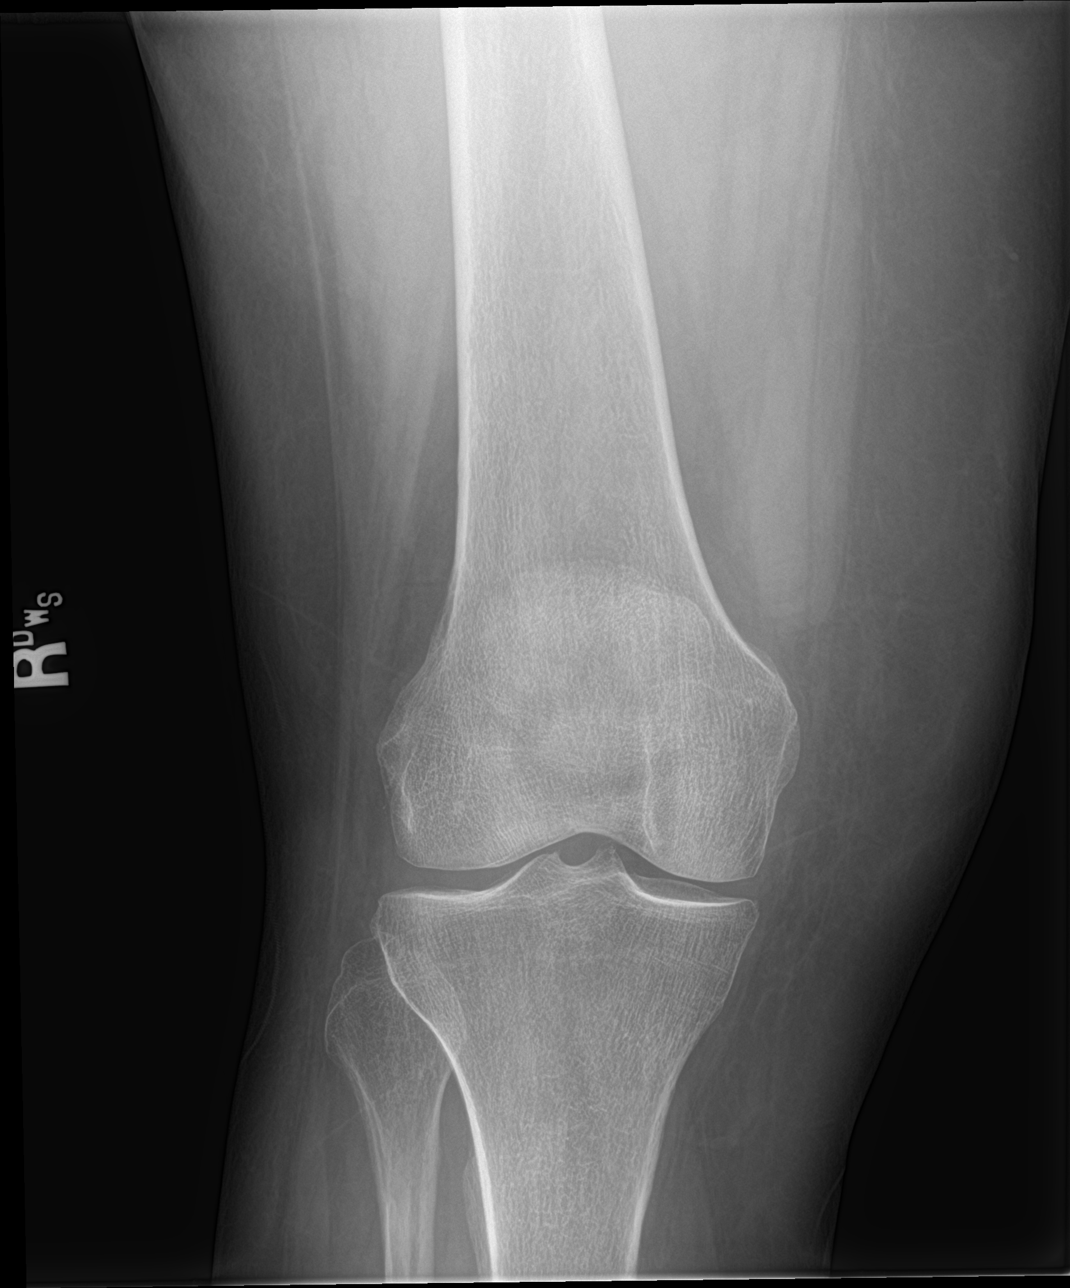

[knee obl (1 of 2)]
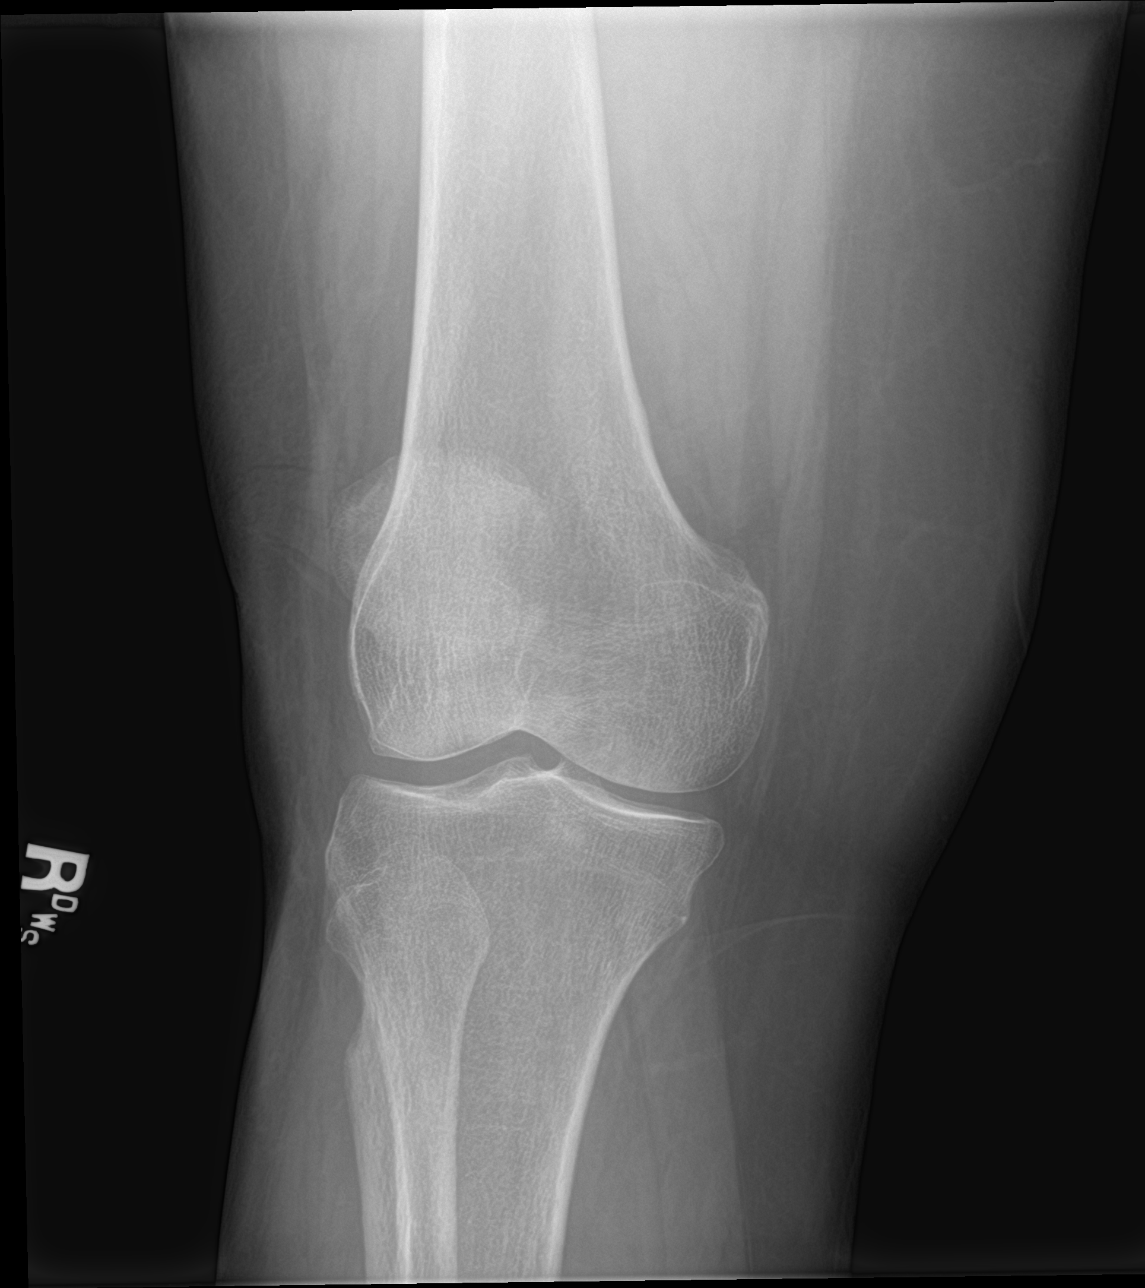

[knee obl (2 of 2)]
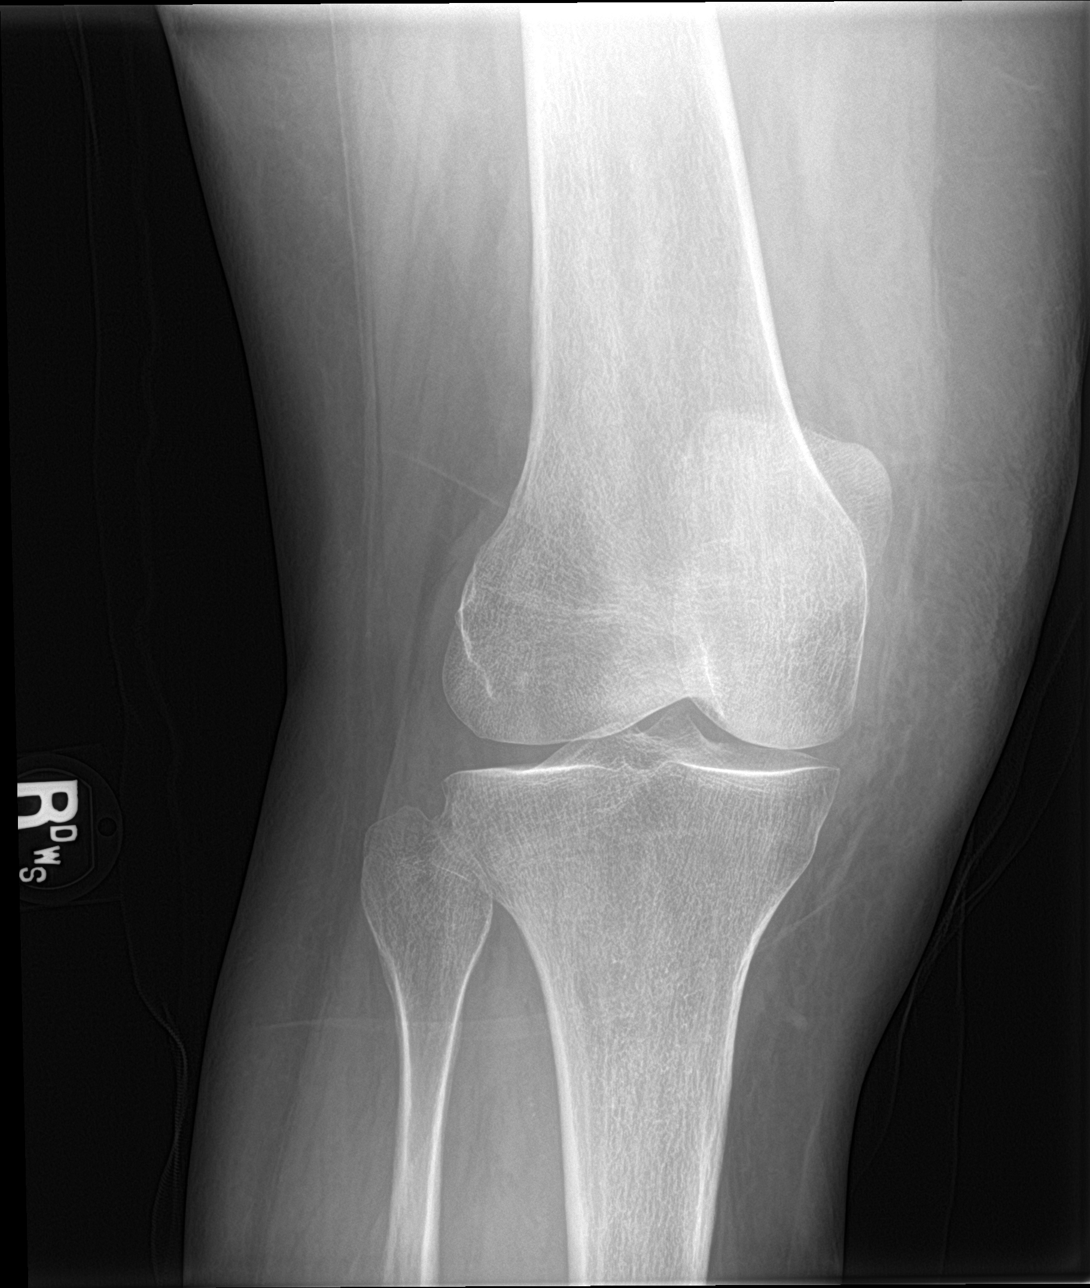

[knee lat]
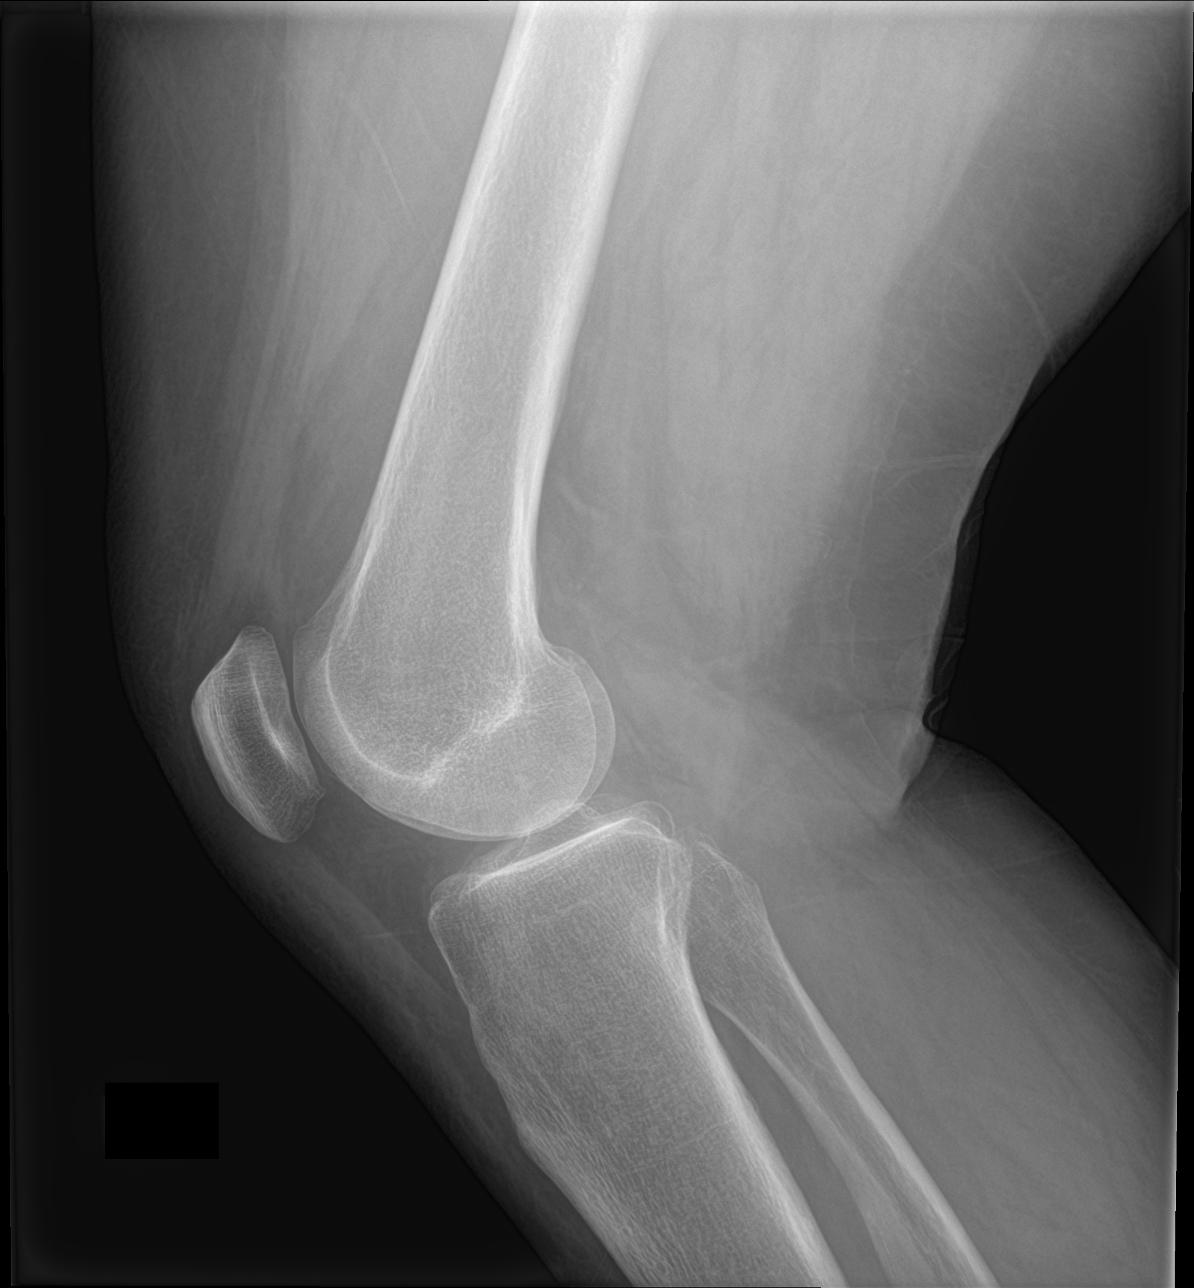

[4 of 4 positions shown; findings below may reference images not displayed]

FINDINGS: No recent fracture or dislocation is seen. Small bony spurs seen in
the patella and medial compartment. There is possible minimal
effusion in the suprapatellar bursa.
IMPRESSION: No recent fracture or dislocation is seen in the right knee.
Possible minimal effusion is present in the suprapatellar bursa.

There are degenerative changes tiny bony spurs in the medial and
patellofemoral compartments.

## 2024-03-09 ENCOUNTER — Ambulatory Visit
Admission: EM | Admit: 2024-03-09 | Discharge: 2024-03-09 | Disposition: A | Discharge status: Home / Self Care | Attending: Physician Assistant | Admitting: Physician Assistant

## 2024-03-09 DIAGNOSIS — N3001 Acute cystitis with hematuria: Secondary | ICD-10-CM | POA: Insufficient documentation

## 2024-03-09 HISTORY — DX: Unspecified atrial fibrillation: I48.91

## 2024-03-09 LAB — POCT URINALYSIS DIP (MANUAL ENTRY)
Glucose, UA: 100 mg/dL — AB
Nitrite, UA: POSITIVE — AB
Protein Ur, POC: >=300 mg/dL — AB
Spec Grav, UA: 1.02 (ref 1.010–1.025)
Urobilinogen, UA: 4 U/dL — AB
pH, UA: 5 (ref 5.0–8.0)

## 2024-03-09 MED ORDER — CEPHALEXIN 500 MG PO CAPS: 500.0000 mg | ORAL_CAPSULE | Freq: Four times a day (QID) | ORAL | 0 refills | Status: DC

## 2024-03-09 NOTE — ED Provider Notes (Addendum)
 Leslie Lewis    CSN: [id number] Arrival date & time: 03/09/24  1914      History   Chief Complaint Chief Complaint  Patient presents with   Urinary Frequency    HPI Leslie Lewis is a 78 y.o. female.   Patient presents today with a 24-hour history of UTI symptoms.  Reports of frequency, urgency, dysuria.  Denies any pelvic pain, vaginal discharge, abdominal pain, hematuria, fever, nausea, vomiting.  She does have a history of UTI with last episode several years ago with similar presentation.  She contacted her PCP and had a UA done earlier as a lab visit only but she did not hear back from them and given her abnormal results with current symptoms she did not want to wait the weekend to start medication.  She denies any recent antibiotics.  Denies any recent urogenital procedure or catheterization.  She denies history of nephrolithiasis.  Denies history of diabetes and does not take SGLT2 inhibitor.  She does have a history of breast cancer but was treated several years ago and is not currently receiving chemotherapy or other immunosuppressive medications.  She has been taking Azo without improvement of symptoms.    Past Medical History:  Diagnosis Date   A-fib (HCC)    Breast cancer (HCC)    High cholesterol    Hypertension     There are no active problems to display for this patient.   Past Surgical History:  Procedure Laterality Date   BACK SURGERY     BREAST SURGERY     BUNIONECTOMY     FRACTURE SURGERY      OB History   No obstetric history on file.      Home Medications    Prior to Admission medications   Medication Sig Start Date End Date Taking? Authorizing Provider  atorvastatin (LIPITOR) 10 MG tablet Take 10 mg by mouth daily. 06/30/23  Yes [provider]  benazepril (LOTENSIN) 40 MG tablet Take 40 mg by mouth daily. 06/30/23  Yes [provider]  cephALEXin (KEFLEX) 500 MG capsule Take 1 capsule (500 mg total) by mouth 4  (four) times daily. 03/09/24  Yes Hennessey Cantrell K, PA-C  diltiazem (CARDIZEM CD) 240 MG 24 hr capsule Take 240 mg by mouth. 07/13/23 07/12/24 Yes [provider]  exemestane (AROMASIN) 25 MG tablet Take 25 mg by mouth daily. 03/02/24  Yes [provider]  furosemide (LASIX) 20 MG tablet TAKE 1 TABLET BY MOUTH DAILY AS NEEDED FOR EDEMA 12/26/23  Yes [provider]  hydrochlorothiazide (HYDRODIURIL) 12.5 MG tablet Take 12.5 mg by mouth daily. 12/26/23  Yes [provider]  levothyroxine (SYNTHROID) 75 MCG tablet TAKE ONE TABLET BY MOUTH EVERY MORNING. TAKE ON AN EMPTY STOMACH WITH A GLASS OF WATER AT LEAST 30-60 MINUTES BEFORE BREAKFAST. 06/30/23  Yes [provider]  nebivolol (BYSTOLIC) 10 MG tablet TAKE 1 TABLET BY MOUTH DAILY IN ADDTION TO YOUR 5 MG TABLET FOR A TOTAL OF 15 MG TOTAL DAILY 06/30/23  Yes [provider]  apixaban  (ELIQUIS ) 5 MG TABS tablet Take 1 tablet (5 mg total) by mouth 2 (two) times daily. 09/08/22 10/08/22  Willo Dunnings, MD  meclizine  (ANTIVERT ) 12.5 MG tablet Take 1 tablet (12.5 mg total) by mouth 3 (three) times daily as needed for dizziness or nausea. 06/22/23   Dicky Anes, MD  metoprolol  tartrate (LOPRESSOR ) 25 MG tablet Take 1 tablet (25 mg total) by mouth 2 (two) times daily. Patient not taking: Reported  on 03/09/2024 09/08/22 12/07/22  Willo Dunnings, MD  ondansetron  (ZOFRAN -ODT) 4 MG disintegrating tablet Take 1 tablet (4 mg total) by mouth every 6 (six) hours as needed for nausea or vomiting. 06/22/23   Dicky Anes, MD    Family History History reviewed. No pertinent family history.  Social History Social History   Tobacco Use   Smoking status: Never   Smokeless tobacco: Never  Substance Use Topics   Alcohol use: Yes    Alcohol/week: 3.0 standard drinks of alcohol    Types: 3 Glasses of wine per week   Drug use: Never     Allergies   Patient has no known allergies.   Review of Systems Review of  Systems  Constitutional:  Positive for activity change. Negative for appetite change, fatigue and fever.  Gastrointestinal:  Negative for abdominal pain, diarrhea, nausea and vomiting.  Genitourinary:  Positive for dysuria, frequency and urgency. Negative for hematuria, pelvic pain, vaginal bleeding, vaginal discharge and vaginal pain.     Physical Exam Triage Vital Signs ED Triage Vitals  Encounter Vitals Group     BP 03/09/24 1931 (!) 144/65     Girls Systolic BP Percentile --      Girls Diastolic BP Percentile --      Boys Systolic BP Percentile --      Boys Diastolic BP Percentile --      Pulse Rate 03/09/24 1931 76     Resp 03/09/24 1931 18     Temp 03/09/24 1931 97.8 F (36.6 C)     Temp src --      SpO2 03/09/24 1931 95 %     Weight --      Height --      Head Circumference --      Peak Flow --      Pain Score 03/09/24 1925 0     Pain Loc --      Pain Education --      Exclude from Growth Chart --    No data found.  Updated Vital Signs BP (!) 144/65   Pulse 76   Temp 97.8 F (36.6 C)   Resp 18   SpO2 95%   Visual Acuity Right Eye Distance:   Left Eye Distance:   Bilateral Distance:    Right Eye Near:   Left Eye Near:    Bilateral Near:     Physical Exam Vitals reviewed.  Constitutional:      General: She is awake. She is not in acute distress.    Appearance: Normal appearance. She is well-developed. She is not ill-appearing.     Comments: Very pleasant female appears stated age in no acute distress sitting comfortably in exam room  HENT:     Head: Normocephalic and atraumatic.   Cardiovascular:     Rate and Rhythm: Normal rate and regular rhythm.     Heart sounds: Normal heart sounds, S1 normal and S2 normal. No murmur heard. Pulmonary:     Effort: Pulmonary effort is normal.     Breath sounds: Normal breath sounds. No wheezing, rhonchi or rales.     Comments: Clear to auscultation bilaterally Abdominal:     General: Bowel sounds are normal.      Palpations: Abdomen is soft.     Tenderness: There is no abdominal tenderness. There is no right CVA tenderness, left CVA tenderness, guarding or rebound.     Comments: Benign abdominal exam  Genitourinary:    Comments: Exam deferred  Psychiatric:  Behavior: Behavior is cooperative.      UC Treatments / Results  Labs (all labs ordered are listed, but only abnormal results are displayed) Labs Reviewed  POCT URINALYSIS DIP (MANUAL ENTRY) - Abnormal; Notable for the following components:      Result Value   Color, UA orange (*)    Clarity, UA cloudy (*)    Glucose, UA =100 (*)    Bilirubin, UA small (*)    Ketones, POC UA small (15) (*)    Blood, UA large (*)    Protein Ur, POC >=300 (*)    Urobilinogen, UA 4.0 (*)    Nitrite, UA Positive (*)    Leukocytes, UA Large (3+) (*)    All other components within normal limits  URINE CULTURE    EKG   Radiology No results found.  Procedures Procedures (including critical care time)  Medications Ordered in UC Medications - No data to display  Initial Impression / Assessment and Plan / UC Course  I have reviewed the triage vital signs and the nursing notes.  Pertinent labs & imaging results that were available during my care of the patient were reviewed by me and considered in my medical decision making (see chart for details).     Patient is well-appearing, afebrile, nontoxic, nontachycardic.  Vital signs and physical exam are reassuring with no indication for emergent evaluation or imaging. UA was obtained that was consistent with urinary tract infection though she did take Azo which could be contributing to abnormal UA results.  She did have blood in her urine which could be related to the discoloration from Azo but we discussed that when she is feeling better she should follow-up with her primary care for repeat UA to ensure that this resolves following treatment of UTI.  Given she is symptomatic we will treat for  UTI.  She was started on cephalexin 500 mg 4 times daily for 5 days.  No indication for dose adjustment based on metabolic panel from 02/21/2024 with creatinine of 0.8 and calculated creatinine clearance of 97.11 mL/min.  Will send this for culture and contact her if we need to discontinue or change her antibiotics based on culture results.  Recommend that she rest and drink plenty of fluid.  Discussed that if anything changes or worsens and she has a severe abdominal pain, fever, nausea, vomiting, weakness, persistent or worsening urinary symptoms she needs to be seen immediately.  Strict return precautions given.  Excuse note provided.   Final Clinical Impressions(s) / UC Diagnoses   Final diagnoses:  Acute cystitis with hematuria     Discharge Instructions      We are treating you for urinary tract infection.  Please start cephalexin 4 times daily for 5 days.  Make sure you rest and drink plenty of fluid.  I will contact you if need to stop or change your antibiotics based on your culture results. There was some blood noted in your urinalysis and so I recommend that you follow-up with your primary care in 2 to 4 weeks to have this repeated and ensure that it goes away once we have treated the infection. If anything worsens and you have abdominal pain, blood in your urine, nausea, vomiting, fever, weakness, persistent or worsening symptoms you should be seen immediately.       ED Prescriptions     Medication Sig Dispense Auth. Provider   cephALEXin (KEFLEX) 500 MG capsule Take 1 capsule (500 mg total) by mouth 4 (four) times  daily. 20 capsule Leslie Heckman K, PA-C      PDMP not reviewed this encounter.   Leslie Rocky POUR, PA-C 03/09/24 1947    Sharnette Kitamura, Rocky POUR, PA-C 03/09/24 1948

## 2024-03-09 NOTE — ED Triage Notes (Signed)
 Patient to Urgent Care with complaints of urinary frequency/ urgency/ dysuria. Denies any known fevers.   Symptoms started yesterday.   Otc meds: azo

## 2024-03-09 NOTE — Discharge Instructions (Signed)
 We are treating you for urinary tract infection.  Please start cephalexin 4 times daily for 5 days.  Make sure you rest and drink plenty of fluid.  I will contact you if need to stop or change your antibiotics based on your culture results. There was some blood noted in your urinalysis and so I recommend that you follow-up with your primary care in 2 to 4 weeks to have this repeated and ensure that it goes away once we have treated the infection. If anything worsens and you have abdominal pain, blood in your urine, nausea, vomiting, fever, weakness, persistent or worsening symptoms you should be seen immediately.

## 2024-03-12 ENCOUNTER — Ambulatory Visit (HOSPITAL_COMMUNITY): Payer: Self-pay

## 2024-03-12 LAB — URINE CULTURE: Culture: >=100000 — AB

## 2024-03-24 ENCOUNTER — Emergency Department

## 2024-03-24 ENCOUNTER — Observation Stay
Admission: EM | Admit: 2024-03-24 | Discharge: 2024-03-25 | Disposition: A | Discharge status: Home / Self Care | Attending: Internal Medicine | Admitting: Internal Medicine

## 2024-03-24 ENCOUNTER — Other Ambulatory Visit: Payer: Self-pay

## 2024-03-24 DIAGNOSIS — Z79899 Other long term (current) drug therapy: Secondary | ICD-10-CM | POA: Diagnosis not present

## 2024-03-24 DIAGNOSIS — Z853 Personal history of malignant neoplasm of breast: Secondary | ICD-10-CM | POA: Insufficient documentation

## 2024-03-24 DIAGNOSIS — N390 Urinary tract infection, site not specified: Secondary | ICD-10-CM | POA: Diagnosis not present

## 2024-03-24 DIAGNOSIS — E039 Hypothyroidism, unspecified: Secondary | ICD-10-CM | POA: Insufficient documentation

## 2024-03-24 DIAGNOSIS — G459 Transient cerebral ischemic attack, unspecified: Secondary | ICD-10-CM | POA: Diagnosis not present

## 2024-03-24 DIAGNOSIS — I480 Paroxysmal atrial fibrillation: Secondary | ICD-10-CM | POA: Insufficient documentation

## 2024-03-24 DIAGNOSIS — I10 Essential (primary) hypertension: Secondary | ICD-10-CM | POA: Diagnosis not present

## 2024-03-24 DIAGNOSIS — E785 Hyperlipidemia, unspecified: Secondary | ICD-10-CM | POA: Diagnosis not present

## 2024-03-24 DIAGNOSIS — R519 Headache, unspecified: Principal | ICD-10-CM

## 2024-03-24 DIAGNOSIS — Z7901 Long term (current) use of anticoagulants: Secondary | ICD-10-CM | POA: Insufficient documentation

## 2024-03-24 DIAGNOSIS — N179 Acute kidney failure, unspecified: Secondary | ICD-10-CM

## 2024-03-24 DIAGNOSIS — H538 Other visual disturbances: Secondary | ICD-10-CM | POA: Diagnosis present

## 2024-03-24 LAB — URINALYSIS, ROUTINE W REFLEX MICROSCOPIC
Bilirubin Urine: NEGATIVE
Glucose, UA: NEGATIVE mg/dL
Ketones, ur: NEGATIVE mg/dL
Nitrite: NEGATIVE
Protein, ur: NEGATIVE mg/dL
Specific Gravity, Urine: 1.014 (ref 1.005–1.030)
pH: 7 (ref 5.0–8.0)

## 2024-03-24 LAB — COMPREHENSIVE METABOLIC PANEL WITH GFR
ALT: 16 U/L (ref 0–44)
AST: 23 U/L (ref 15–41)
Albumin: 4.4 g/dL (ref 3.5–5.0)
Alkaline Phosphatase: 43 U/L (ref 38–126)
Anion gap: 11 (ref 5–15)
BUN: 23 mg/dL (ref 8–23)
CO2: 28 mmol/L (ref 22–32)
Calcium: 9.6 mg/dL (ref 8.9–10.3)
Chloride: 101 mmol/L (ref 98–111)
Creatinine, Ser: 1.51 mg/dL — ABNORMAL HIGH (ref 0.44–1.00)
GFR, Estimated: 35 mL/min — ABNORMAL LOW (ref >60)
Glucose, Bld: 113 mg/dL — ABNORMAL HIGH (ref 70–99)
Potassium: 3.8 mmol/L (ref 3.5–5.1)
Sodium: 140 mmol/L (ref 135–145)
Total Bilirubin: 0.6 mg/dL (ref 0.0–1.2)
Total Protein: 7.1 g/dL (ref 6.5–8.1)

## 2024-03-24 LAB — CBC
HCT: 39.3 % (ref 36.0–46.0)
Hemoglobin: 13 g/dL (ref 12.0–15.0)
MCH: 30.5 pg (ref 26.0–34.0)
MCHC: 33.1 g/dL (ref 30.0–36.0)
MCV: 92.3 fL (ref 80.0–100.0)
Platelets: 183 K/uL (ref 150–400)
RBC: 4.26 MIL/uL (ref 3.87–5.11)
RDW: 13.1 % (ref 11.5–15.5)
WBC: 6.4 K/uL (ref 4.0–10.5)
nRBC: 0 % (ref 0.0–0.2)

## 2024-03-24 MED ORDER — SODIUM CHLORIDE 0.9 % IV BOLUS (SEPSIS)
1000.0000 mL | Freq: Once | INTRAVENOUS | Status: AC
  Administered 2024-03-24: 1000 mL via INTRAVENOUS

## 2024-03-24 MED ORDER — SODIUM CHLORIDE 0.9 % IV SOLN
2.0000 g | Freq: Once | INTRAVENOUS | Status: AC
  Administered 2024-03-24: 2 g via INTRAVENOUS
  Filled 2024-03-24: qty 20

## 2024-03-24 NOTE — ED Provider Notes (Signed)
 [facility] Provider Note   Event Date/Time   First MD Initiated Contact with Patient 03/24/24 2114     (approximate) History  Headache and Blurred Vision  HPI Leslie Lewis is a 78 y.o. female with a past medical history of hypertension, high cholesterol, and intermittent atrial fibrillation on Eliquis  who presents complaining of blurry vision to the left eye that lasted approximately 20 minutes.  Patient describes central vision loss that she describes as pixelated that lasted until the end of this episode when she describes blurred vision to the left visual fields and then resolution.  Patient denies any symptoms similar to this in the past.  Patient has not had any recurrence of the symptoms. ROS: Patient currently denies any tinnitus, difficulty speaking, facial droop, sore throat, chest pain, shortness of breath, abdominal pain, nausea/vomiting/diarrhea, dysuria, or weakness/numbness/paresthesias in any extremity   Physical Exam  Triage Vital Signs: ED Triage Vitals [03/24/24 2107]  Encounter Vitals Group     BP (!) 189/70     Girls Systolic BP Percentile      Girls Diastolic BP Percentile      Boys Systolic BP Percentile      Boys Diastolic BP Percentile      Pulse Rate 60     Resp 18     Temp 97.9 F (36.6 C)     Temp Source Oral     SpO2 98 %     Weight 237 lb (107.5 kg)     Height 5' 8 (1.727 m)     Head Circumference      Peak Flow      Pain Score 0     Pain Loc      Pain Education      Exclude from Growth Chart    Most recent vital signs: Vitals:   03/25/24 1515 03/25/24 1622  BP: (!) 141/69 (!) 156/66  Pulse: (!) 58 (!) 56  Resp: 18 19  Temp: 97.6 F (36.4 C)   SpO2: 95% 96%   General: Awake, oriented x4. CV:  Good peripheral perfusion. Resp:  Normal effort. Abd:  No distention. Other:  Elderly O obese Caucasian female resting comfortably in no acute distress.  NIHSS 0 ED Results / Procedures / Treatments  Labs (all  labs ordered are listed, but only abnormal results are displayed) Labs Reviewed  COMPREHENSIVE METABOLIC PANEL WITH GFR - Abnormal; Notable for the following components:      Result Value   Glucose, Bld 113 (*)    Creatinine, Ser 1.51 (*)    GFR, Estimated 35 (*)    All other components within normal limits  URINALYSIS, ROUTINE W REFLEX MICROSCOPIC - Abnormal; Notable for the following components:   Color, Urine YELLOW (*)    APPearance CLEAR (*)    Hgb urine dipstick SMALL (*)    Leukocytes,Ua SMALL (*)    Bacteria, UA RARE (*)    All other components within normal limits  LIPID PANEL - Abnormal; Notable for the following components:   LDL Cholesterol 103 (*)    All other components within normal limits  BASIC METABOLIC PANEL WITH GFR - Abnormal; Notable for the following components:   Calcium  8.8 (*)    All other components within normal limits  CBC - Abnormal; Notable for the following components:   RBC 3.72 (*)    Hemoglobin 11.4 (*)    HCT 34.5 (*)    Platelets 147 (*)    All other components within  normal limits  URINE CULTURE  CBC  HEMOGLOBIN A1C   EKG ED ECG REPORT I, Artist MARLA Kerns, the attending physician, personally viewed and interpreted this ECG. Date: 03/24/2024 EKG Time: 2233 Rate: 55 Rhythm: normal sinus rhythm QRS Axis: normal Intervals: normal ST/T Wave abnormalities: normal Narrative Interpretation: no evidence of acute ischemia RADIOLOGY ED MD interpretation: CT of the head without contrast interpreted by me shows no evidence of acute abnormalities including no intracerebral hemorrhage, obvious masses, or significant edema - All radiology independently interpreted and agree with radiology assessment Official radiology report(s): ECHOCARDIOGRAM COMPLETE BUBBLE STUDY Result Date: 03/25/2024    ECHOCARDIOGRAM REPORT   Patient Name:   Leslie Lewis Date of Exam: 03/25/2024 Medical Rec #:  [id number]        Height:       68.0 in Accession #:     [id number]       Weight:       237.0 lb Date of Birth:  1946/08/01        BSA:          2.197 m Patient Age:    78 years         BP:           136/106 mmHg Patient Gender: F                HR:           66 bpm. Exam Location:  [facility] Procedure: 2D Echo, Cardiac Doppler, Color Doppler and Saline Contrast Bubble            Study (Both Spectral and Color Flow Doppler were utilized during            procedure). Indications:     Stroke 434.91 / I63.9  History:         Patient has no prior history of Echocardiogram examinations.                  Arrythmias:Atrial Fibrillation.  Sonographer:     Bari Roar Referring Phys:  [id number] SUMAYYA AMIN Diagnosing Phys: Dwayne D Callwood MD IMPRESSIONS  1. Left ventricular ejection fraction, by estimation, is 55 to 60%. The left ventricle has normal function. The left ventricle has no regional wall motion abnormalities. There is mild left ventricular hypertrophy. Left ventricular diastolic parameters are consistent with Grade I diastolic dysfunction (impaired relaxation).  2. Right ventricular systolic function is normal. The right ventricular size is normal.  3. Left atrial size was mild to moderately dilated.  4. Right atrial size was mildly dilated.  5. The mitral valve is normal in structure. Trivial mitral valve regurgitation.  6. The aortic valve is calcified. Aortic valve regurgitation is trivial. Aortic valve sclerosis/calcification is present, without any evidence of aortic stenosis. FINDINGS  Left Ventricle: Left ventricular ejection fraction, by estimation, is 55 to 60%. The left ventricle has normal function. The left ventricle has no regional wall motion abnormalities. Strain was performed and the global longitudinal strain is indeterminate. The left ventricular internal cavity size was normal in size. There is mild left ventricular hypertrophy. Left ventricular diastolic parameters are consistent with Grade I diastolic dysfunction (impaired relaxation). Right  Ventricle: The right ventricular size is normal. No increase in right ventricular wall thickness. Right ventricular systolic function is normal. Left Atrium: Left atrial size was mild to moderately dilated. Right Atrium: Right atrial size was mildly dilated. Pericardium: There is no evidence of pericardial effusion. Mitral Valve: The mitral valve is normal in  structure. Trivial mitral valve regurgitation. MV peak gradient, 4.8 mmHg. The mean mitral valve gradient is 2.0 mmHg. Tricuspid Valve: The tricuspid valve is grossly normal. Tricuspid valve regurgitation is mild. Aortic Valve: The aortic valve is calcified. Aortic valve regurgitation is trivial. Aortic regurgitation PHT measures 460 msec. Aortic valve sclerosis/calcification is present, without any evidence of aortic stenosis. Aortic valve mean gradient measures 7.0 mmHg. Aortic valve peak gradient measures 14.1 mmHg. Aortic valve area, by VTI measures 1.89 cm. Pulmonic Valve: The pulmonic valve was grossly normal. Pulmonic valve regurgitation is not visualized. Aorta: The ascending aorta was not well visualized. IAS/Shunts: No atrial level shunt detected by color flow Doppler. Agitated saline contrast was given intravenously to evaluate for intracardiac shunting. Additional Comments: 3D was performed not requiring image post processing on an independent workstation and was indeterminate.  LEFT VENTRICLE PLAX 2D LVIDd:         5.20 cm   Diastology LVIDs:         3.70 cm   LV e' medial:    6.42 cm/s LV PW:         1.20 cm   LV E/e' medial:  14.8 LV IVS:        1.30 cm   LV e' lateral:   5.98 cm/s LVOT diam:     1.90 cm   LV E/e' lateral: 15.9 LV SV:         73 LV SV Index:   33 LVOT Area:     2.84 cm  RIGHT VENTRICLE RV Basal diam:  3.30 cm RV Mid diam:    3.10 cm RV S prime:     14.90 cm/s TAPSE (M-mode): 2.3 cm LEFT ATRIUM             Index        RIGHT ATRIUM           Index LA diam:        4.50 cm 2.05 cm/m   RA Area:     19.50 cm LA Vol (A2C):   75.9  ml 34.55 ml/m  RA Volume:   51.80 ml  23.58 ml/m LA Vol (A4C):   89.5 ml 40.74 ml/m LA Biplane Vol: 82.9 ml 37.74 ml/m  AORTIC VALVE                     PULMONIC VALVE AV Area (Vmax):    1.87 cm      PV Vmax:          1.36 m/s AV Area (Vmean):   1.64 cm      PV Peak grad:     7.4 mmHg AV Area (VTI):     1.89 cm      PR End Diast Vel: 6.15 msec AV Vmax:           188.00 cm/s   RVOT Peak grad:   3 mmHg AV Vmean:          119.000 cm/s AV VTI:            0.388 m AV Peak Grad:      14.1 mmHg AV Mean Grad:      7.0 mmHg LVOT Vmax:         124.00 cm/s LVOT Vmean:        69.000 cm/s LVOT VTI:          0.259 m LVOT/AV VTI ratio: 0.67 AI PHT:  460 msec  AORTA Ao Root diam: 2.80 cm Ao Asc diam:  3.60 cm MITRAL VALVE                TRICUSPID VALVE MV Area (PHT): 3.39 cm     TR Peak grad:   25.6 mmHg MV Area VTI:   2.18 cm     TR Vmax:        253.00 cm/s MV Peak grad:  4.8 mmHg MV Mean grad:  2.0 mmHg     SHUNTS MV Vmax:       1.10 m/s     Systemic VTI:  0.26 m MV Vmean:      70.2 cm/s    Systemic Diam: 1.90 cm MV Decel Time: 224 msec MV E velocity: 94.90 cm/s MV A velocity: 111.00 cm/s MV E/A ratio:  0.85 MV A Prime:    10.8 cm/s Cara JONETTA Lovelace MD Electronically signed by Cara JONETTA Lovelace MD Signature Date/Time: 03/25/2024/3:50:28 PM    Final    CT ANGIO HEAD NECK W WO CM Result Date: 03/25/2024 CLINICAL DATA:  Stroke/TIA, determine embolic source. EXAM: CT ANGIOGRAPHY HEAD AND NECK WITH AND WITHOUT CONTRAST TECHNIQUE: Multidetector CT imaging of the head and neck was performed using the standard protocol during bolus administration of intravenous contrast. Multiplanar CT image reconstructions and MIPs were obtained to evaluate the vascular anatomy. Carotid stenosis measurements (when applicable) are obtained utilizing NASCET criteria, using the distal internal carotid diameter as the denominator. RADIATION DOSE REDUCTION: This exam was performed according to the departmental dose-optimization program  which includes automated exposure control, adjustment of the mA and/or kV according to patient size and/or use of iterative reconstruction technique. CONTRAST:  75mL OMNIPAQUE  IOHEXOL  350 MG/ML SOLN COMPARISON:  MRI and CT head 03/24/2024. FINDINGS: CTA NECK FINDINGS Aortic arch: Great vessel origins are patent. Right carotid system: No evidence of dissection, stenosis (50% or greater), or occlusion. Left carotid system: No evidence of dissection, stenosis (50% or greater), or occlusion. Vertebral arteries: Codominant. No evidence of dissection, stenosis (50% or greater), or occlusion. Skeleton: No evidence of acute abnormality on limited assessment. Other neck: No evidence of acute abnormality on limited assessment. Upper chest: Visualized lung apices are clear. Review of the MIP images confirms the above findings CTA HEAD FINDINGS Anterior circulation: Bilateral intracranial ICAs, MCAs, and ACAs are patent without proximal hemodynamically significant stenosis. Posterior circulation: Bilateral intradural vertebral arteries, basilar artery and bilateral posterior cerebral arteries are patent without proximal hemodynamically significant stenosis. Venous sinuses: Not well assessed on this study. Review of the MIP images confirms the above findings IMPRESSION: No emergent large vessel occlusion or proximal hemodynamically significant stenosis. Electronically Signed   By: Gilmore GORMAN Molt M.D.   On: 03/25/2024 02:15   PROCEDURES: Critical Care performed: No Procedures MEDICATIONS ORDERED IN ED: Medications  sodium chloride  0.9 % bolus 1,000 mL (0 mLs Intravenous Stopped 03/25/24 0047)  cefTRIAXone  (ROCEPHIN ) 2 g in sodium chloride  0.9 % 100 mL IVPB (0 g Intravenous Stopped 03/25/24 0015)  aspirin  chewable tablet 324 mg (324 mg Oral Given 03/25/24 0128)  iohexol  (OMNIPAQUE ) 350 MG/ML injection 75 mL (75 mLs Intravenous Contrast Given 03/25/24 0137)   IMPRESSION / MDM / ASSESSMENT AND PLAN / ED COURSE  I  reviewed the triage vital signs and the nursing notes.                             The patient is on the cardiac  monitor to evaluate for evidence of arrhythmia and/or significant heart rate changes. Patient's presentation is most consistent with acute presentation with potential threat to life or bodily function. This patient presents with a headache most consistent with benign headache from either tension type headache vs migraine. No headache red flags. Neurologic exam without evidence of meningismus, AMS, focal neurologic findings so doubt meningitis, encephalitis, stroke. Presentation not consistent with acute intracranial bleed to include SAH (lack of risk factors, headache history). No history of trauma so doubt ICH. Given history and physical temporal arteritis unlikely, as is acute angle closure glaucoma. Doubt carotid artery dissection given no focal neuro deficits, no neck trauma or recent neck strain.  Head CT negative for any acute abnormalities.  Patient with no signs of increased intracranial pressure or weight loss and history and physical suggest more benign headache so less likely mass effect in brain from tumor or abscess or idiopathic intracranial hypertension. Pain was controlled with headache cocktail and patient discharged home with PMD follow up.   FINAL CLINICAL IMPRESSION(S) / ED DIAGNOSES   Final diagnoses:  Acute nonintractable headache, unspecified headache type  Blurred vision, left eye  Acute UTI  AKI (acute kidney injury) (HCC)   Rx / DC Orders   ED Discharge Orders          Ordered    atorvastatin  (LIPITOR) 40 MG tablet  Daily        03/25/24 1600    Increase activity slowly        03/25/24 1600    Diet - low sodium heart healthy        03/25/24 1600    Discharge instructions       Comments: It was pleasure taking care of you. Please keep yourself well-hydrated and resume your home medications as you are doing it before including your blood pressure  medications from tomorrow.  Your home Lipitor dose was increased to 40 mg daily to reach to your goal of LDL less than 70 Please follow-up with your doctors closely for further recommendation.   03/25/24 1600           Note:  This document was prepared using Dragon voice recognition software and may include unintentional dictation errors.   Jossie Artist POUR, MD 03/25/24 [phone]

## 2024-03-24 NOTE — ED Provider Notes (Signed)
 11:30 PM  Assumed care at shift change.

## 2024-03-24 NOTE — ED Notes (Signed)
 Pt up to toilet in treatment room to obtain urine sample. Pt ambulated independently with steady gait.

## 2024-03-24 NOTE — ED Notes (Signed)
 Pt states her vision is completely back to normal at this time. No complaints. EDP made aware.

## 2024-03-24 NOTE — ED Triage Notes (Signed)
 Patient C/O dull headache and blurred vision that began at 2015. Patient states that the blurred vision has resolved at this time. Patient is on eliquis  for afib. Patient denies numbness, tingling, sensation changes or syncope. Patient is alert and oriented x4 with steady gate. No speaking difficulty or facial droop noted.

## 2024-03-25 ENCOUNTER — Observation Stay

## 2024-03-25 ENCOUNTER — Observation Stay
Admit: 2024-03-25 | Discharge: 2024-03-25 | Disposition: A | Discharge status: Home / Self Care | Attending: Internal Medicine

## 2024-03-25 DIAGNOSIS — R519 Headache, unspecified: Secondary | ICD-10-CM | POA: Diagnosis not present

## 2024-03-25 DIAGNOSIS — I480 Paroxysmal atrial fibrillation: Secondary | ICD-10-CM | POA: Insufficient documentation

## 2024-03-25 DIAGNOSIS — N179 Acute kidney failure, unspecified: Secondary | ICD-10-CM | POA: Diagnosis not present

## 2024-03-25 DIAGNOSIS — E785 Hyperlipidemia, unspecified: Secondary | ICD-10-CM | POA: Insufficient documentation

## 2024-03-25 DIAGNOSIS — I4891 Unspecified atrial fibrillation: Secondary | ICD-10-CM | POA: Diagnosis not present

## 2024-03-25 DIAGNOSIS — Z7901 Long term (current) use of anticoagulants: Secondary | ICD-10-CM

## 2024-03-25 DIAGNOSIS — N390 Urinary tract infection, site not specified: Secondary | ICD-10-CM | POA: Diagnosis not present

## 2024-03-25 DIAGNOSIS — I10 Essential (primary) hypertension: Secondary | ICD-10-CM | POA: Insufficient documentation

## 2024-03-25 DIAGNOSIS — E039 Hypothyroidism, unspecified: Secondary | ICD-10-CM | POA: Insufficient documentation

## 2024-03-25 DIAGNOSIS — G458 Other transient cerebral ischemic attacks and related syndromes: Secondary | ICD-10-CM | POA: Diagnosis not present

## 2024-03-25 DIAGNOSIS — H538 Other visual disturbances: Secondary | ICD-10-CM | POA: Diagnosis not present

## 2024-03-25 DIAGNOSIS — G459 Transient cerebral ischemic attack, unspecified: Secondary | ICD-10-CM | POA: Diagnosis present

## 2024-03-25 LAB — LIPID PANEL
Cholesterol: 174 mg/dL (ref 0–200)
HDL: 60 mg/dL (ref >40)
LDL Cholesterol: 103 mg/dL — ABNORMAL HIGH (ref 0–99)
Total CHOL/HDL Ratio: 2.9 ratio
Triglycerides: 54 mg/dL (ref <150)
VLDL: 11 mg/dL (ref 0–40)

## 2024-03-25 LAB — BASIC METABOLIC PANEL WITH GFR
Anion gap: 8 (ref 5–15)
BUN: 22 mg/dL (ref 8–23)
CO2: 27 mmol/L (ref 22–32)
Calcium: 8.8 mg/dL — ABNORMAL LOW (ref 8.9–10.3)
Chloride: 102 mmol/L (ref 98–111)
Creatinine, Ser: 0.94 mg/dL (ref 0.44–1.00)
GFR, Estimated: >60 mL/min (ref >60)
Glucose, Bld: 92 mg/dL (ref 70–99)
Potassium: 3.8 mmol/L (ref 3.5–5.1)
Sodium: 137 mmol/L (ref 135–145)

## 2024-03-25 LAB — CBC
HCT: 34.5 % — ABNORMAL LOW (ref 36.0–46.0)
Hemoglobin: 11.4 g/dL — ABNORMAL LOW (ref 12.0–15.0)
MCH: 30.6 pg (ref 26.0–34.0)
MCHC: 33 g/dL (ref 30.0–36.0)
MCV: 92.7 fL (ref 80.0–100.0)
Platelets: 147 K/uL — ABNORMAL LOW (ref 150–400)
RBC: 3.72 MIL/uL — ABNORMAL LOW (ref 3.87–5.11)
RDW: 13.2 % (ref 11.5–15.5)
WBC: 4.8 K/uL (ref 4.0–10.5)
nRBC: 0 % (ref 0.0–0.2)

## 2024-03-25 LAB — ECHOCARDIOGRAM COMPLETE BUBBLE STUDY
AR max vel: 1.87 cm2
AV Area VTI: 1.89 cm2
AV Area mean vel: 1.64 cm2
AV Mean grad: 7 mmHg
AV Peak grad: 14.1 mmHg
Ao pk vel: 1.88 m/s
Area-P 1/2: 3.39 cm2
MV VTI: 2.18 cm2
P 1/2 time: 460 ms
S' Lateral: 3.7 cm

## 2024-03-25 MED ORDER — ONDANSETRON HCL 4 MG PO TABS: 4.0000 mg | ORAL_TABLET | Freq: Four times a day (QID) | ORAL | Status: DC | PRN

## 2024-03-25 MED ORDER — NEBIVOLOL HCL 10 MG PO TABS
10.0000 mg | ORAL_TABLET | Freq: Every day | ORAL | Status: DC
  Administered 2024-03-25: 10 mg via ORAL
  Filled 2024-03-25: qty 1

## 2024-03-25 MED ORDER — BENAZEPRIL HCL 20 MG PO TABS: 40.0000 mg | ORAL_TABLET | Freq: Every day | ORAL | Status: DC

## 2024-03-25 MED ORDER — EXEMESTANE 25 MG PO TABS
25.0000 mg | ORAL_TABLET | Freq: Every day | ORAL | Status: DC
  Administered 2024-03-25: 25 mg via ORAL
  Filled 2024-03-25: qty 1

## 2024-03-25 MED ORDER — IOHEXOL 350 MG/ML SOLN
75.0000 mL | Freq: Once | INTRAVENOUS | Status: AC | PRN
  Administered 2024-03-25: 75 mL via INTRAVENOUS

## 2024-03-25 MED ORDER — MAGNESIUM HYDROXIDE 400 MG/5ML PO SUSP: 30.0000 mL | Freq: Every day | ORAL | Status: DC | PRN

## 2024-03-25 MED ORDER — ATORVASTATIN CALCIUM 40 MG PO TABS: 40.0000 mg | ORAL_TABLET | Freq: Every day | ORAL | 1 refills | Status: AC

## 2024-03-25 MED ORDER — VITAMIN D3 25 MCG (1000 UNIT) PO TABS
1000.0000 [IU] | ORAL_TABLET | Freq: Every day | ORAL | Status: DC
  Administered 2024-03-25: 1000 [IU] via ORAL
  Filled 2024-03-25 (×2): qty 1

## 2024-03-25 MED ORDER — LEVOTHYROXINE SODIUM 50 MCG PO TABS
75.0000 ug | ORAL_TABLET | Freq: Every day | ORAL | Status: DC
  Administered 2024-03-25: 75 ug via ORAL
  Filled 2024-03-25: qty 1

## 2024-03-25 MED ORDER — ENOXAPARIN SODIUM 40 MG/0.4ML IJ SOSY: 40.0000 mg | PREFILLED_SYRINGE | INTRAMUSCULAR | Status: DC

## 2024-03-25 MED ORDER — ASPIRIN 81 MG PO TBEC
81.0000 mg | DELAYED_RELEASE_TABLET | Freq: Every day | ORAL | Status: DC
  Administered 2024-03-25: 81 mg via ORAL
  Filled 2024-03-25: qty 1

## 2024-03-25 MED ORDER — SODIUM CHLORIDE 0.9 % IV SOLN: INTRAVENOUS | Status: DC

## 2024-03-25 MED ORDER — APIXABAN 5 MG PO TABS
5.0000 mg | ORAL_TABLET | Freq: Two times a day (BID) | ORAL | Status: DC
  Administered 2024-03-25 (×2): 5 mg via ORAL
  Filled 2024-03-25 (×2): qty 1

## 2024-03-25 MED ORDER — ACETAMINOPHEN 650 MG RE SUPP: 650.0000 mg | Freq: Four times a day (QID) | RECTAL | Status: DC | PRN

## 2024-03-25 MED ORDER — CALCIUM CITRATE 950 (200 CA) MG PO TABS
200.0000 mg | ORAL_TABLET | Freq: Every day | ORAL | Status: DC
  Administered 2024-03-25: 950 mg via ORAL
  Filled 2024-03-25: qty 1

## 2024-03-25 MED ORDER — SODIUM CHLORIDE 0.9 % IV SOLN: 1.0000 g | INTRAVENOUS | Status: DC

## 2024-03-25 MED ORDER — DILTIAZEM HCL ER COATED BEADS 240 MG PO CP24
240.0000 mg | ORAL_CAPSULE | Freq: Every day | ORAL | Status: DC
  Administered 2024-03-25: 240 mg via ORAL
  Filled 2024-03-25 (×2): qty 1

## 2024-03-25 MED ORDER — MELATONIN 5 MG PO TABS: 2.5000 mg | ORAL_TABLET | Freq: Every evening | ORAL | Status: DC | PRN

## 2024-03-25 MED ORDER — HYDRALAZINE HCL 20 MG/ML IJ SOLN: 10.0000 mg | Freq: Four times a day (QID) | INTRAMUSCULAR | Status: DC | PRN

## 2024-03-25 MED ORDER — ATORVASTATIN CALCIUM 20 MG PO TABS
20.0000 mg | ORAL_TABLET | Freq: Every day | ORAL | Status: DC
  Administered 2024-03-25: 20 mg via ORAL
  Filled 2024-03-25: qty 1

## 2024-03-25 MED ORDER — ONDANSETRON HCL 4 MG/2ML IJ SOLN: 4.0000 mg | Freq: Four times a day (QID) | INTRAMUSCULAR | Status: DC | PRN

## 2024-03-25 MED ORDER — STROKE: EARLY STAGES OF RECOVERY BOOK: Freq: Once | Status: DC

## 2024-03-25 MED ORDER — HYDROCHLOROTHIAZIDE 12.5 MG PO TABS
12.5000 mg | ORAL_TABLET | Freq: Every day | ORAL | Status: DC
  Administered 2024-03-25: 12.5 mg via ORAL
  Filled 2024-03-25: qty 1

## 2024-03-25 MED ORDER — FUROSEMIDE 40 MG PO TABS: 20.0000 mg | ORAL_TABLET | Freq: Every day | ORAL | Status: DC

## 2024-03-25 MED ORDER — ASPIRIN 81 MG PO CHEW
324.0000 mg | CHEWABLE_TABLET | Freq: Once | ORAL | Status: AC
  Administered 2024-03-25: 324 mg via ORAL
  Filled 2024-03-25: qty 4

## 2024-03-25 MED ORDER — ACETAMINOPHEN 325 MG PO TABS: 650.0000 mg | ORAL_TABLET | Freq: Four times a day (QID) | ORAL | Status: DC | PRN

## 2024-03-25 NOTE — ED Notes (Signed)
Report received from Ashley RN.

## 2024-03-25 NOTE — ED Notes (Signed)
 Pt resting in bed with eyes closed. Pt is well appearing with RR even and unlabored.

## 2024-03-25 NOTE — ED Notes (Signed)
 Pt ambulated to bathroom and back independently

## 2024-03-25 NOTE — ED Notes (Signed)
 Assumed care of pt. Report received from previous RN. Pt alert and oriented. Pt on CCM and pulse ox at this time. Pt RR even and unlabored. Pt denies any needs at this time.

## 2024-03-25 NOTE — Assessment & Plan Note (Signed)
-   The patient be hydrated with IV normal saline. - Will hold off nephrotoxins. - Will follow BMPs.

## 2024-03-25 NOTE — H&P (Addendum)
 [place]   PATIENT NAME: Leslie Lewis    MR#:  [id number]  DATE OF BIRTH:  08-14-1946  DATE OF ADMISSION:  03/24/2024  PRIMARY CARE PHYSICIAN: Sherial Bail, MD   Patient is coming from: Home  REQUESTING/REFERRING PHYSICIAN: Ward, Josette SAILOR, DO  CHIEF COMPLAINT:   Chief Complaint  Patient presents with   Headache   Blurred Vision    HISTORY OF PRESENT ILLNESS:  Leslie Lewis is a 78 y.o. Caucasian female with medical history significant for paroxysmal atrial fibrillation on Eliquis , hypertension and dyslipidemia as well as breast cancer, who presented to the emergency room with acute onset of blurred vision of the left eye that lasted about 20 minutes.  The patient described central vision loss that he stated was pixelated and that he had blurred vision to the left eye followed by resolution.  He denied any paresthesias or focal muscle weakness.  No headache or dizziness.  No diplopia.  No nausea or vomiting or abdominal pain.  No chest pain or palpitations.  No cough or wheezing or dyspnea.  No dysuria, oliguria, urinary frequency or urgency or flank pain.  She had a UTI couple weeks ago.  ED Course: When she came to the ER, BP was 189/70 with otherwise normal vital signs.  Labs revealed creatinine 1.51 with otherwise normal CMP.  CBC was within normal.  UA came back positive for UTI. EKG as reviewed by me : EKG showed sinus bradycardia with a rate of 55 with first-degree AV block and T wave inversion in V1 with flattened T waves in V2 and V3. Imaging: Noncontrast head CT scan revealed no acute intracranial malady.  It showed mucosal thickening with air-fluid level in the left sphenoid sinus.  CTA of the head and neck revealed no emergent large vessel occlusion or proximal hemodynamically significant stenosis.  Brain MRI without contrast showed no acute intra abnormality.  The patient was given 1 L bolus of IV normal saline and 1 g of IV Rocephin .  He will be  admitted to a medical telemetry observation bed for further evaluation and management. PAST MEDICAL HISTORY:   Past Medical History:  Diagnosis Date   A-fib (HCC)    Breast cancer (HCC)    High cholesterol    Hypertension     PAST SURGICAL HISTORY:   Past Surgical History:  Procedure Laterality Date   BACK SURGERY     BREAST SURGERY     BUNIONECTOMY     FRACTURE SURGERY      SOCIAL HISTORY:   Social History   Tobacco Use   Smoking status: Never   Smokeless tobacco: Never  Substance Use Topics   Alcohol use: Yes    Alcohol/week: 3.0 standard drinks of alcohol    Types: 3 Glasses of wine per week    FAMILY HISTORY:   Positive for cancer.  DRUG ALLERGIES:   Allergies  Allergen Reactions   Trazodone     REVIEW OF SYSTEMS:   ROS As per history of present illness. All pertinent systems were reviewed above. Constitutional, HEENT, cardiovascular, respiratory, GI, GU, musculoskeletal, neuro, psychiatric, endocrine, integumentary and hematologic systems were reviewed and are otherwise negative/unremarkable except for positive findings mentioned above in the HPI.   MEDICATIONS AT HOME:   Prior to Admission medications   Medication Sig Start Date End Date Taking? Authorizing Provider  apixaban  (ELIQUIS ) 5 MG TABS tablet Take 1 tablet (5 mg total) by mouth 2 (two) times daily. 09/08/22 03/25/24 Yes  Willo Dunnings, MD  atorvastatin  (LIPITOR) 20 MG tablet Take 20 mg by mouth daily. 06/30/23  Yes [provider]  benazepril  (LOTENSIN ) 40 MG tablet Take 40 mg by mouth daily. 06/30/23  Yes [provider]  calcium  citrate (CALCITRATE - DOSED IN MG ELEMENTAL CALCIUM ) 950 (200 Ca) MG tablet Take 200 mg of elemental calcium  by mouth daily.   Yes [provider]  diltiazem  (CARDIZEM  CD) 240 MG 24 hr capsule Take 240 mg by mouth daily. 07/13/23 07/12/24 Yes [provider]  exemestane  (AROMASIN ) 25 MG tablet Take 25 mg by mouth daily. 03/02/24   Yes [provider]  furosemide  (LASIX ) 20 MG tablet TAKE 1 TABLET BY MOUTH DAILY AS NEEDED FOR EDEMA 12/26/23  Yes [provider]  hydrochlorothiazide  (HYDRODIURIL ) 12.5 MG tablet Take 12.5 mg by mouth daily. 12/26/23  Yes [provider]  levothyroxine  (SYNTHROID ) 75 MCG tablet TAKE ONE TABLET BY MOUTH EVERY MORNING. TAKE ON AN EMPTY STOMACH WITH A GLASS OF WATER AT LEAST 30-60 MINUTES BEFORE BREAKFAST. 06/30/23  Yes [provider]  nebivolol  (BYSTOLIC ) 10 MG tablet Take 10 mg by mouth daily. 06/30/23  Yes [provider]  VITAMIN D, CHOLECALCIFEROL , PO Take 1 capsule by mouth daily.   Yes [provider]  cephALEXin  (KEFLEX ) 500 MG capsule Take 1 capsule (500 mg total) by mouth 4 (four) times daily. Patient not taking: Reported on 03/25/2024 03/09/24   Raspet, Erin K, PA-C  meclizine  (ANTIVERT ) 12.5 MG tablet Take 1 tablet (12.5 mg total) by mouth 3 (three) times daily as needed for dizziness or nausea. Patient not taking: Reported on 03/25/2024 06/22/23   Dicky Anes, MD  metoprolol  tartrate (LOPRESSOR ) 25 MG tablet Take 1 tablet (25 mg total) by mouth 2 (two) times daily. Patient not taking: Reported on 03/09/2024 09/08/22 12/07/22  Willo Dunnings, MD  ondansetron  (ZOFRAN -ODT) 4 MG disintegrating tablet Take 1 tablet (4 mg total) by mouth every 6 (six) hours as needed for nausea or vomiting. Patient not taking: Reported on 03/25/2024 06/22/23   Dicky Anes, MD      VITAL SIGNS:  Blood pressure (!) 169/68, pulse (!) 57, temperature 97.9 F (36.6 C), temperature source Oral, resp. rate 17, height 5' 8 (1.727 m), weight 107.5 kg, SpO2 98%.  PHYSICAL EXAMINATION:  Physical Exam  GENERAL:  78 y.o.-year-old patient lying in the bed with no acute distress.  EYES: Pupils equal, round, reactive to light and accommodation. No scleral icterus. Extraocular muscles intact.  HEENT: Head atraumatic, normocephalic. Oropharynx and nasopharynx clear.   NECK:  Supple, no jugular venous distention. No thyroid  enlargement, no tenderness.  LUNGS: Normal breath sounds bilaterally, no wheezing, rales,rhonchi or crepitation. No use of accessory muscles of respiration.  CARDIOVASCULAR: Regular rate and rhythm, S1, S2 normal. No murmurs, rubs, or gallops.  ABDOMEN: Soft, nondistended, nontender. Bowel sounds present. No organomegaly or mass.  EXTREMITIES: No pedal edema, cyanosis, or clubbing.  NEUROLOGIC: Cranial nerves II through XII are intact. Muscle strength 5/5 in all extremities. Sensation intact. Gait not checked.  PSYCHIATRIC: The patient is alert and oriented x 3.  Normal affect and good eye contact. SKIN: No obvious rash, lesion, or ulcer.   LABORATORY PANEL:   CBC Recent Labs  Lab 03/24/24 2101  WBC 6.4  HGB 13.0  HCT 39.3  PLT 183   ------------------------------------------------------------------------------------------------------------------  Chemistries  Recent Labs  Lab 03/24/24 2101  NA 140  K 3.8  CL 101  CO2 28  GLUCOSE 113*  BUN 23  CREATININE 1.51*  CALCIUM  9.6  AST 23  ALT 16  ALKPHOS 43  BILITOT 0.6   ------------------------------------------------------------------------------------------------------------------  Cardiac Enzymes No results for input(s): TROPONINI in the last 168 hours. ------------------------------------------------------------------------------------------------------------------  RADIOLOGY:  CT ANGIO HEAD NECK W WO CM Result Date: 03/25/2024 CLINICAL DATA:  Stroke/TIA, determine embolic source. EXAM: CT ANGIOGRAPHY HEAD AND NECK WITH AND WITHOUT CONTRAST TECHNIQUE: Multidetector CT imaging of the head and neck was performed using the standard protocol during bolus administration of intravenous contrast. Multiplanar CT image reconstructions and MIPs were obtained to evaluate the vascular anatomy. Carotid stenosis measurements (when applicable) are obtained utilizing NASCET  criteria, using the distal internal carotid diameter as the denominator. RADIATION DOSE REDUCTION: This exam was performed according to the departmental dose-optimization program which includes automated exposure control, adjustment of the mA and/or kV according to patient size and/or use of iterative reconstruction technique. CONTRAST:  75mL OMNIPAQUE  IOHEXOL  350 MG/ML SOLN COMPARISON:  MRI and CT head 03/24/2024. FINDINGS: CTA NECK FINDINGS Aortic arch: Great vessel origins are patent. Right carotid system: No evidence of dissection, stenosis (50% or greater), or occlusion. Left carotid system: No evidence of dissection, stenosis (50% or greater), or occlusion. Vertebral arteries: Codominant. No evidence of dissection, stenosis (50% or greater), or occlusion. Skeleton: No evidence of acute abnormality on limited assessment. Other neck: No evidence of acute abnormality on limited assessment. Upper chest: Visualized lung apices are clear. Review of the MIP images confirms the above findings CTA HEAD FINDINGS Anterior circulation: Bilateral intracranial ICAs, MCAs, and ACAs are patent without proximal hemodynamically significant stenosis. Posterior circulation: Bilateral intradural vertebral arteries, basilar artery and bilateral posterior cerebral arteries are patent without proximal hemodynamically significant stenosis. Venous sinuses: Not well assessed on this study. Review of the MIP images confirms the above findings IMPRESSION: No emergent large vessel occlusion or proximal hemodynamically significant stenosis. Electronically Signed   By: Gilmore GORMAN Molt M.D.   On: 03/25/2024 02:15   MR BRAIN WO CONTRAST Result Date: 03/25/2024 CLINICAL DATA:  Neuro deficit, acute, stroke suspected EXAM: MRI HEAD WITHOUT CONTRAST TECHNIQUE: Multiplanar, multiecho pulse sequences of the brain and surrounding structures were obtained without intravenous contrast. COMPARISON:  CT head from earlier today.  MRI head June 22, 2023. FINDINGS: Brain: Similar advanced scattered no acute infarction, hemorrhage, hydrocephalus, extra-axial collection or mass lesion. T2/FLAIR hyperintensities in the white matter, which are nonspecific but compatible with chronic microvascular ischemic change. Vascular: Major arterial flow voids are maintained at the skull base. Skull and upper cervical spine: Normal marrow signal. Sinuses/Orbits: Air-fluid levels within the left sphenoid sinus. No acute orbital findings. Other: No mastoid effusions. IMPRESSION: No evidence of acute intracranial abnormality. Electronically Signed   By: Gilmore GORMAN Molt M.D.   On: 03/25/2024 00:02   CT Head Wo Contrast Result Date: 03/24/2024 CLINICAL DATA:  Dull headache and blurred vision. EXAM: CT HEAD WITHOUT CONTRAST TECHNIQUE: Contiguous axial images were obtained from the base of the skull through the vertex without intravenous contrast. RADIATION DOSE REDUCTION: This exam was performed according to the departmental dose-optimization program which includes automated exposure control, adjustment of the mA and/or kV according to patient size and/or use of iterative reconstruction technique. COMPARISON:  MRI head 06/22/2023 FINDINGS: Brain: No intracranial hemorrhage, mass effect, or evidence of acute infarct. No hydrocephalus. No extra-axial fluid collection. Age-commensurate cerebral atrophy and chronic small vessel ischemic disease. Vascular: No hyperdense vessel. Intracranial arterial calcification. Skull: No fracture or focal lesion. Sinuses/Orbits: Mucosal thickening with air-fluid level in the left sphenoid  sinus. Mastoid air cells are well aerated. Globes are intact. Other: None. IMPRESSION: 1. No acute intracranial abnormality. 2. Mucosal thickening with air-fluid level in the left sphenoid sinus. Correlate for acute sinusitis. Electronically Signed   By: Norman Gatlin M.D.   On: 03/24/2024 21:30      IMPRESSION AND PLAN:  Assessment and Plan: * TIA  (transient ischemic attack) - This was manifested by reversible visual changes including central visual loss and left hemianopsia that lasted about 20 minutes. - The patient will be admitted to an observation medically monitored bed.   - We will follow neuro checks q.4 hours for 24 hours.   - The patient will be placed on aspirin .   - Will obtain a 2D echo with bubble study .   - A neurology consultation  as well as physical/occupation/speech therapy consults will be obtained in a.m.SABRA   - The patient will be placed on statin therapy and fasting lipids will be checked.   AKI (acute kidney injury) (HCC) - The patient be hydrated with IV normal saline. - Will hold off nephrotoxins. - Will follow BMPs.  Acute lower UTI - Will continue him on IV Rocephin  and follow urine culture and sensitivity.  Hypothyroidism - Will continue Synthroid .  Essential hypertension - Will continue antihypertensive therapy with permissive parameter  Paroxysmal atrial fibrillation (HCC) - Will continue Eliquis  and Cardizem  CD.  Dyslipidemia - Will continue statin therapy and check fasting lipids.   DVT prophylaxis: Lovenox .. Advanced Care Planning:  Code Status: full code. Family Communication:  The plan of care was discussed in details with the patient (and family). I answered all questions. The patient agreed to proceed with the above mentioned plan. Further management will depend upon hospital course. Disposition Plan: Back to previous home environment Consults called: Neurology All the records are reviewed and case discussed with ED provider.  Status is: Observation  I certify that at the time of admission, it is my clinical judgment that the patient will require  hospital care extending less than 2 midnights.                            Dispo: The patient is from: Home              Anticipated d/c is to: Home              Patient currently is not medically stable to d/c.              Difficult  to place patient: No  Madison DELENA Peaches M.D on 03/25/2024 at 3:19 AM  [facility]   From 7 PM-7 AM, contact night-coverage [email or web address]  CC: Primary care physician; Sherial Bail, MD

## 2024-03-25 NOTE — Progress Notes (Signed)
 OT Cancellation Note  Patient Details Name: [name] MRN: [id number] DOB: 12-16-1945   Cancelled Treatment:    Reason Eval/Treat Not Completed: OT screened, no needs identified. Pt appears to be at her baseline, denies any ongoing vision concerns or other symptoms. Able to ambulate INDly w/o AD and to perform ADL INDly. Will complete OT orders at this time.   Suzen Hock 03/25/2024, 11:08 AM

## 2024-03-25 NOTE — Progress Notes (Signed)
*  PRELIMINARY RESULTS* Echocardiogram 2D Echocardiogram has been performed.  Bari BROCKS Robert Sunga 03/25/2024, 11:36 AM

## 2024-03-25 NOTE — ED Notes (Signed)
 CCMD notified of pt placed on cardiac monitor

## 2024-03-25 NOTE — Assessment & Plan Note (Signed)
-   Will continue antihypertensive therapy with permissive parameter

## 2024-03-25 NOTE — Consult Note (Signed)
 NEUROLOGY CONSULT NOTE   Date of service: March 25, 2024 Patient Name: Leslie Lewis MRN:  [id number] DOB:  03-09-1946 Chief Complaint: Transient vision loss Requesting Provider: Caleen Qualia, MD  History of Present Illness  Leslie Lewis is a 78 y.o. female with hx of atrial fibrillation on Eliquis , hypertension, hyperlipidemia presenting to the emergency department for about 20 minutes worth of central vision loss in the left eye Reports he was sitting on the couch after dinner, watching TV and working on her phone reading an email when suddenly her central vision became very pixelated.  Does not describe it as a curtain falling.  Reports the spot moved as she moved her eyes. 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  She had a headache in the back of her head at that time as well.  No prior history of migraines.  Reports compliance with medications.  No tingling numbness or weakness.  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 No pertinent family history.  Social History  reports that she has never smoked. She has never used smokeless tobacco. She reports current alcohol use of about 3.0 standard drinks of alcohol per week. She reports that she does not use drugs.  Allergies  Allergen Reactions   Trazodone      Medications   Current Facility-Administered Medications:    [START ON 03/26/2024]  stroke: early stages of recovery book, , Does not apply, Once, Mansy, Jan A, MD   0.9 %  sodium chloride  infusion, , Intravenous, Continuous, Mansy, Jan A, MD, Last Rate: 100 mL/hr at 03/25/24 0132, New Bag at 03/25/24 0132   acetaminophen  (TYLENOL ) tablet 650 mg, 650 mg, Oral, Q6H PRN **OR** acetaminophen  (TYLENOL ) suppository 650 mg, 650 mg, Rectal, Q6H PRN, Mansy, Jan A, MD   apixaban  (ELIQUIS ) tablet 5 mg, 5 mg, Oral, BID, Mansy, Jan A, MD, 5 mg at 03/25/24 0925   aspirin  EC tablet 81 mg, 81 mg, Oral, Daily, Mansy, Jan A, MD, 81 mg at 03/25/24 9076   atorvastatin  (LIPITOR) tablet 20 mg, 20 mg, Oral, Daily, Mansy, Jan A, MD, 20 mg at 03/25/24 0925   calcium  citrate (CALCITRATE - dosed in mg elemental calcium ) tablet 950 mg, 200 mg of elemental calcium , Oral, Daily, Mansy, Jan A, MD, 950 mg at 03/25/24 9075   cefTRIAXone  (ROCEPHIN ) 1 g in sodium chloride  0.9 % 100 mL IVPB, 1 g, Intravenous, Q24H, Mansy, Jan A, MD   cholecalciferol  (VITAMIN D3) tablet 1,000 Units, 1,000 Units, Oral, Daily, Mansy, Jan A, MD, 1,000 Units at 03/25/24 9076   diltiazem  (CARDIZEM  CD) 24 hr capsule 240 mg, 240 mg, Oral, Daily, Mansy, Jan A, MD, 240 mg at 03/25/24 0924   exemestane  (AROMASIN ) tablet 25 mg,  25 mg, Oral, Daily, Mansy, Jan A, MD, 25 mg at 03/25/24 9074   hydrALAZINE  (APRESOLINE ) injection 10 mg, 10 mg, Intravenous, Q6H PRN, Mansy, Jan A, MD   hydrochlorothiazide  (HYDRODIURIL ) tablet 12.5 mg, 12.5 mg, Oral, Daily, Mansy, Jan A, MD, 12.5 mg at 03/25/24 9075   levothyroxine  (SYNTHROID ) tablet 75 mcg, 75 mcg, Oral, Q0600, Mansy, Jan A, MD, 75 mcg at 03/25/24 0604   magnesium  hydroxide (MILK OF MAGNESIA) suspension 30 mL, 30 mL, Oral, Daily PRN, Mansy, Jan A, MD   melatonin tablet 2.5 mg, 2.5 mg, Oral, QHS PRN, Mansy, Jan A, MD   nebivolol  (BYSTOLIC ) tablet 10 mg, 10 mg, Oral, Daily, Mansy, Jan A, MD, 10 mg at 03/25/24 9076    ondansetron  (ZOFRAN ) tablet 4 mg, 4 mg, Oral, Q6H PRN **OR** ondansetron  (ZOFRAN ) injection 4 mg, 4 mg, Intravenous, Q6H PRN, Mansy, Jan A, MD  Current Outpatient Medications:    apixaban  (ELIQUIS ) 5 MG TABS tablet, Take 1 tablet (5 mg total) by mouth 2 (two) times daily., Disp: 60 tablet, Rfl: 0   atorvastatin  (LIPITOR) 20 MG tablet, Take 20 mg by mouth daily., Disp: , Rfl:    benazepril  (LOTENSIN ) 40 MG tablet, Take 40 mg by mouth daily., Disp: , Rfl:    calcium  citrate (CALCITRATE - DOSED IN MG ELEMENTAL CALCIUM ) 950 (200 Ca) MG tablet, Take 200 mg of elemental calcium  by mouth daily., Disp: , Rfl:    diltiazem  (CARDIZEM  CD) 240 MG 24 hr capsule, Take 240 mg by mouth daily., Disp: , Rfl:    exemestane  (AROMASIN ) 25 MG tablet, Take 25 mg by mouth daily., Disp: , Rfl:    furosemide  (LASIX ) 20 MG tablet, TAKE 1 TABLET BY MOUTH DAILY AS NEEDED FOR EDEMA, Disp: , Rfl:    hydrochlorothiazide  (HYDRODIURIL ) 12.5 MG tablet, Take 12.5 mg by mouth daily., Disp: , Rfl:    levothyroxine  (SYNTHROID ) 75 MCG tablet, TAKE ONE TABLET BY MOUTH EVERY MORNING. TAKE ON AN EMPTY STOMACH WITH A GLASS OF WATER AT LEAST 30-60 MINUTES BEFORE BREAKFAST., Disp: , Rfl:    nebivolol  (BYSTOLIC ) 10 MG tablet, Take 10 mg by mouth daily., Disp: , Rfl:    VITAMIN D, CHOLECALCIFEROL , PO, Take 1 capsule by mouth daily., Disp: , Rfl:    cephALEXin  (KEFLEX ) 500 MG capsule, Take 1 capsule (500 mg total) by mouth 4 (four) times daily. (Patient not taking: Reported on 03/25/2024), Disp: 20 capsule, Rfl: 0   meclizine  (ANTIVERT ) 12.5 MG tablet, Take 1 tablet (12.5 mg total) by mouth 3 (three) times daily as needed for dizziness or nausea. (Patient not taking: Reported on 03/25/2024), Disp: 30 tablet, Rfl: 1   metoprolol  tartrate (LOPRESSOR ) 25 MG tablet, Take 1 tablet (25 mg total) by mouth 2 (two) times daily. (Patient not taking: Reported on 03/09/2024), Disp: 60 tablet, Rfl: 2   ondansetron  (ZOFRAN -ODT) 4 MG disintegrating tablet, Take 1  tablet (4 mg total) by mouth every 6 (six) hours as needed for nausea or vomiting. (Patient not taking: Reported on 03/25/2024), Disp: 20 tablet, Rfl: 0  Vitals   Vitals:   03/25/24 0315 03/25/24 0402 03/25/24 0710 03/25/24 0732  BP: (!) 179/63 (!) 156/69  (!) 136/106  Pulse:  (!) 55 (!) 59 (!) 58  Resp: 18 15 15 16   Temp:    98 F (36.7 C)  TempSrc:    Oral  SpO2:  99% 97% 99%  Weight:      Height:        Body mass index is  36.04 kg/m.   Physical Exam   General: Awake alert in no distress HEENT: Normocephalic atraumatic Chest is clear CVS: Regular rate rhythm Abdomen nondistended nontender Neurological exam Awake alert oriented x 3.  No dysarthria.  No aphasia.  Cranial nerves II to XII at baseline right eye blindness with light perception only, otherwise unremarkable exam.  Motor examination with no drift.  Sensation intact.  No dysmetria  Labs/Imaging/Neurodiagnostic studies   CBC:  Recent Labs  Lab 04-03-24 2101 03/25/24 0402  WBC 6.4 4.8  HGB 13.0 11.4*  HCT 39.3 34.5*  MCV 92.3 92.7  PLT 183 147*   Basic Metabolic Panel:  Lab Results  Component Value Date   NA 137 03/25/2024   K 3.8 03/25/2024   CO2 27 03/25/2024   GLUCOSE 92 03/25/2024   BUN 22 03/25/2024   CREATININE 0.94 03/25/2024   CALCIUM  8.8 (L) 03/25/2024   GFRNONAA >60 03/25/2024  Urinalysis with small leukocyte esterase, rare bacteria, 11-20 WBCs.  Lipid Panel:  Lab Results  Component Value Date   LDLCALC 103 (H) 03/25/2024   HgbA1c: No results found for: HGBA1C-pending  CT Head without contrast(Personally reviewed): No acute abnormality  CT angio Head and Neck with contrast(Personally reviewed): No ELVO or proximal hemodynamically significant stenosis.  MRI Brain(Personally reviewed): No acute abnormality  2D echo pending  ASSESSMENT   Leslie Lewis is a 78 y.o. female past history of hypertension hyperlipidemia atrial fibrillation on Eliquis  with a 20-minute episode of  central vision loss in the left eye concerning for an ophthalmic TIA.  According to the hospitalist team also possible AKI and UTI.  Impression: Ophthalmic TIA the setting of underlying atrial fibrillation-etiology catabolic of the  RECOMMENDATIONS  Frequent neurochecks Telemetry Permissive hypertension for another day or so and then eventual blood pressure goal as outpatient is normotension.  Since she is on anticoagulation, I would not let her blood pressure go higher than systolic 180, and treat if it is higher than that value as needed today. Continue Eliquis  She is on atorvastatin  20 at home.  Her LDL is 103.  Goal should be less than 70.  I would recommend increasing atorvastatin  to 40 mg daily. Therapy assessments 2D echo-pending Please call back if 2D echo shows any significant abnormalities needing further input or consult cardiology as needed. Patient to follow-up with [facility] neurology in 8 to 12 weeks.  Inpatient neurology will be available as needed  Plan was relayed to Dr. Caleen ______________________________________________________________________    Signed, Eligio Lav, MD [facility]

## 2024-03-25 NOTE — ED Notes (Signed)
 Pt discharged at this time. RN reviewed discharge instructions with pt. Pt verbalized understanding. Vital signs taken. RR even and unlabored. Pt denies any questions or needs at this time. Pt ambulatory at discharge.

## 2024-03-25 NOTE — Assessment & Plan Note (Signed)
-   Will continue him on IV Rocephin  and follow urine culture and sensitivity.

## 2024-03-25 NOTE — ED Notes (Signed)
 Notified Mansy MD regarding pt elevated BP readings.

## 2024-03-25 NOTE — ED Notes (Signed)
 Pt transported to CT ?

## 2024-03-25 NOTE — Discharge Summary (Signed)
 Physician Discharge Summary   Patient: Leslie Lewis MRN: [id number] DOB: [birth year]  Admit date:     03/24/2024  Discharge date: 03/25/24  Discharge Physician: Amaryllis Dare   PCP: Sherial Bail, MD   Recommendations at discharge:  Please obtain CBC and BMP and follow-up Follow-up with primary care provider  Discharge Diagnoses: Principal Problem:   TIA (transient ischemic attack) Active Problems:   AKI (acute kidney injury) (HCC)   Acute lower UTI   Dyslipidemia   Paroxysmal atrial fibrillation (HCC)   Essential hypertension   Hypothyroidism   Acute nonintractable headache   Blurred vision, left eye   Hospital Course: Taken from H&P.  Leslie Lewis is a 78 y.o. Caucasian female with medical history significant for paroxysmal atrial fibrillation on Eliquis , hypertension and dyslipidemia as well as breast cancer, who presented to the emergency room with acute onset of blurred vision of the left eye that lasted about 20 minutes.  Symptoms completely resolved and no other focal deficit.  On presentation elevated blood pressure at 189/70, labs with creatinine of 1.51, UA concerning for UTI with mild leukocytes and rare bacteria.  Culture was ordered. EKG with borderline sinus bradycardia at 55 and first-degree heart block. Noncontrast CT head with no acute intracranial abnormality. MRI brain also shows no acute abnormality. CTA of head and neck revealed no emergent large vessel occlusion or proximal hemodynamically significant stenosis.  Patient was admitted to complete workup for concern of TIA.  7/13: Vital stable, lipid panel with LDL of 103, otherwise normal, AKI resolved. Patient just recently completed the course of antibiotics for pansensitive E. coli in urine.  No current urinary symptoms antibiotics were discontinued.  Patient with no new deficit and her blurry vision was resolved before coming to the ED.  Neurology evaluated her and they recommended increase  in Crestor to 40 mg daily.  She was on 20 mg at home.  We held her home antihypertensives today and she can resume her home medications from tomorrow.  Echocardiogram with normal EF and grade 1 diastolic dysfunction.  Mild to moderately dilated both atrium and no other significant abnormality noted. PT with no specific follow-up recommendations.  Patient is at baseline now.  Patient will resume her home medications with the change of increasing the dose of Crestor and follow-up with her providers closely for further management.  Consultants: Neurology Procedures performed: None Disposition: Home Diet recommendation:  Discharge Diet Orders (From admission, onward)     Start     Ordered   03/25/24 0000  Diet - low sodium heart healthy        03/25/24 1600           Cardiac diet DISCHARGE MEDICATION: Allergies as of 03/25/2024       Reactions   Trazodone         Medication List     STOP taking these medications    cephALEXin  500 MG capsule Commonly known as: KEFLEX    metoprolol  tartrate 25 MG tablet Commonly known as: LOPRESSOR    ondansetron  4 MG disintegrating tablet Commonly known as: ZOFRAN -ODT       TAKE these medications    apixaban  5 MG Tabs tablet Commonly known as: ELIQUIS  Take 1 tablet (5 mg total) by mouth 2 (two) times daily.   atorvastatin  40 MG tablet Commonly known as: LIPITOR Take 1 tablet (40 mg total) by mouth daily. What changed:  medication strength how much to take   benazepril  40 MG tablet Commonly known as: LOTENSIN  Take 40  mg by mouth daily.   calcium  citrate 950 (200 Ca) MG tablet Commonly known as: CALCITRATE - dosed in mg elemental calcium  Take 200 mg of elemental calcium  by mouth daily.   diltiazem  240 MG 24 hr capsule Commonly known as: CARDIZEM  CD Take 240 mg by mouth daily.   exemestane  25 MG tablet Commonly known as: AROMASIN  Take 25 mg by mouth daily.   furosemide  20 MG tablet Commonly known as: LASIX  TAKE 1  TABLET BY MOUTH DAILY AS NEEDED FOR EDEMA   hydrochlorothiazide  12.5 MG tablet Commonly known as: HYDRODIURIL  Take 12.5 mg by mouth daily.   levothyroxine  75 MCG tablet Commonly known as: SYNTHROID  TAKE ONE TABLET BY MOUTH EVERY MORNING. TAKE ON AN EMPTY STOMACH WITH A GLASS OF WATER AT LEAST 30-60 MINUTES BEFORE BREAKFAST.   meclizine  12.5 MG tablet Commonly known as: ANTIVERT  Take 1 tablet (12.5 mg total) by mouth 3 (three) times daily as needed for dizziness or nausea.   nebivolol  10 MG tablet Commonly known as: BYSTOLIC  Take 10 mg by mouth daily.   VITAMIN D (CHOLECALCIFEROL ) PO Take 1 capsule by mouth daily.        Follow-up Information     Sherial Bail, MD .   Specialty: Internal Medicine Contact information: [address] [place] [address] [phone]                Discharge Exam: Leslie Lewis   03/24/24 2107  Weight: 107.5 kg   General.  Obese lady, in no acute distress. Pulmonary.  Lungs clear bilaterally, normal respiratory effort. CV.  Regular rate and rhythm, no JVD, rub or murmur. Abdomen.  Soft, nontender, nondistended, BS positive. CNS.  Alert and oriented .  No focal neurologic deficit. Extremities.  No edema, no cyanosis, pulses intact and symmetrical. Psychiatry.  Judgment and insight appears normal.   Condition at discharge: stable  The results of significant diagnostics from this hospitalization (including imaging, microbiology, ancillary and laboratory) are listed below for reference.   Imaging Studies: ECHOCARDIOGRAM COMPLETE BUBBLE STUDY Result Date: 03/25/2024    ECHOCARDIOGRAM REPORT   Patient Name:   Leslie Lewis Date of Exam: 03/25/2024 Medical Rec #:  [id number]        Height:       68.0 in Accession #:    [id number]       Weight:       237.0 lb Date of Birth:  Jun 09, 1946        BSA:          2.197 m Patient Age:    78 years         BP:           136/106 mmHg Patient Gender: F                HR:            66 bpm. Exam Location:  [facility] Procedure: 2D Echo, Cardiac Doppler, Color Doppler and Saline Contrast Bubble            Study (Both Spectral and Color Flow Doppler were utilized during            procedure). Indications:     Stroke 434.91 / I63.9  History:         Patient has no prior history of Echocardiogram examinations.                  Arrythmias:Atrial Fibrillation.  Sonographer:     Bari Roar Referring Phys:  [id number] Pharrah Rottman Diagnosing Phys: Cara JONETTA Lovelace MD IMPRESSIONS  1. Left ventricular ejection fraction, by estimation, is 55 to 60%. The left ventricle has normal function. The left ventricle has no regional wall motion abnormalities. There is mild left ventricular hypertrophy. Left ventricular diastolic parameters are consistent with Grade I diastolic dysfunction (impaired relaxation).  2. Right ventricular systolic function is normal. The right ventricular size is normal.  3. Left atrial size was mild to moderately dilated.  4. Right atrial size was mildly dilated.  5. The mitral valve is normal in structure. Trivial mitral valve regurgitation.  6. The aortic valve is calcified. Aortic valve regurgitation is trivial. Aortic valve sclerosis/calcification is present, without any evidence of aortic stenosis. FINDINGS  Left Ventricle: Left ventricular ejection fraction, by estimation, is 55 to 60%. The left ventricle has normal function. The left ventricle has no regional wall motion abnormalities. Strain was performed and the global longitudinal strain is indeterminate. The left ventricular internal cavity size was normal in size. There is mild left ventricular hypertrophy. Left ventricular diastolic parameters are consistent with Grade I diastolic dysfunction (impaired relaxation). Right Ventricle: The right ventricular size is normal. No increase in right ventricular wall thickness. Right ventricular systolic function is normal. Left Atrium: Left atrial size was mild to moderately dilated. Right  Atrium: Right atrial size was mildly dilated. Pericardium: There is no evidence of pericardial effusion. Mitral Valve: The mitral valve is normal in structure. Trivial mitral valve regurgitation. MV peak gradient, 4.8 mmHg. The mean mitral valve gradient is 2.0 mmHg. Tricuspid Valve: The tricuspid valve is grossly normal. Tricuspid valve regurgitation is mild. Aortic Valve: The aortic valve is calcified. Aortic valve regurgitation is trivial. Aortic regurgitation PHT measures 460 msec. Aortic valve sclerosis/calcification is present, without any evidence of aortic stenosis. Aortic valve mean gradient measures 7.0 mmHg. Aortic valve peak gradient measures 14.1 mmHg. Aortic valve area, by VTI measures 1.89 cm. Pulmonic Valve: The pulmonic valve was grossly normal. Pulmonic valve regurgitation is not visualized. Aorta: The ascending aorta was not well visualized. IAS/Shunts: No atrial level shunt detected by color flow Doppler. Agitated saline contrast was given intravenously to evaluate for intracardiac shunting. Additional Comments: 3D was performed not requiring image post processing on an independent workstation and was indeterminate.  LEFT VENTRICLE PLAX 2D LVIDd:         5.20 cm   Diastology LVIDs:         3.70 cm   LV e' medial:    6.42 cm/s LV PW:         1.20 cm   LV E/e' medial:  14.8 LV IVS:        1.30 cm   LV e' lateral:   5.98 cm/s LVOT diam:     1.90 cm   LV E/e' lateral: 15.9 LV SV:         73 LV SV Index:   33 LVOT Area:     2.84 cm  RIGHT VENTRICLE RV Basal diam:  3.30 cm RV Mid diam:    3.10 cm RV S prime:     14.90 cm/s TAPSE (M-mode): 2.3 cm LEFT ATRIUM             Index        RIGHT ATRIUM           Index LA diam:        4.50 cm 2.05 cm/m   RA Area:     19.50 cm LA Vol (A2C):  75.9 ml 34.55 ml/m  RA Volume:   51.80 ml  23.58 ml/m LA Vol (A4C):   89.5 ml 40.74 ml/m LA Biplane Vol: 82.9 ml 37.74 ml/m  AORTIC VALVE                     PULMONIC VALVE AV Area (Vmax):    1.87 cm      PV Vmax:           1.36 m/s AV Area (Vmean):   1.64 cm      PV Peak grad:     7.4 mmHg AV Area (VTI):     1.89 cm      PR End Diast Vel: 6.15 msec AV Vmax:           188.00 cm/s   RVOT Peak grad:   3 mmHg AV Vmean:          119.000 cm/s AV VTI:            0.388 m AV Peak Grad:      14.1 mmHg AV Mean Grad:      7.0 mmHg LVOT Vmax:         124.00 cm/s LVOT Vmean:        69.000 cm/s LVOT VTI:          0.259 m LVOT/AV VTI ratio: 0.67 AI PHT:            460 msec  AORTA Ao Root diam: 2.80 cm Ao Asc diam:  3.60 cm MITRAL VALVE                TRICUSPID VALVE MV Area (PHT): 3.39 cm     TR Peak grad:   25.6 mmHg MV Area VTI:   2.18 cm     TR Vmax:        253.00 cm/s MV Peak grad:  4.8 mmHg MV Mean grad:  2.0 mmHg     SHUNTS MV Vmax:       1.10 m/s     Systemic VTI:  0.26 m MV Vmean:      70.2 cm/s    Systemic Diam: 1.90 cm MV Decel Time: 224 msec MV E velocity: 94.90 cm/s MV A velocity: 111.00 cm/s MV E/A ratio:  0.85 MV A Prime:    10.8 cm/s Cara JONETTA Lovelace MD Electronically signed by Cara JONETTA Lovelace MD Signature Date/Time: 03/25/2024/3:50:28 PM    Final    CT ANGIO HEAD NECK W WO CM Result Date: 03/25/2024 CLINICAL DATA:  Stroke/TIA, determine embolic source. EXAM: CT ANGIOGRAPHY HEAD AND NECK WITH AND WITHOUT CONTRAST TECHNIQUE: Multidetector CT imaging of the head and neck was performed using the standard protocol during bolus administration of intravenous contrast. Multiplanar CT image reconstructions and MIPs were obtained to evaluate the vascular anatomy. Carotid stenosis measurements (when applicable) are obtained utilizing NASCET criteria, using the distal internal carotid diameter as the denominator. RADIATION DOSE REDUCTION: This exam was performed according to the departmental dose-optimization program which includes automated exposure control, adjustment of the mA and/or kV according to patient size and/or use of iterative reconstruction technique. CONTRAST:  75mL OMNIPAQUE  IOHEXOL  350 MG/ML SOLN COMPARISON:  MRI  and CT head 03/24/2024. FINDINGS: CTA NECK FINDINGS Aortic arch: Great vessel origins are patent. Right carotid system: No evidence of dissection, stenosis (50% or greater), or occlusion. Left carotid system: No evidence of dissection, stenosis (50% or greater), or occlusion. Vertebral arteries: Codominant. No evidence of dissection, stenosis (50% or greater), or  occlusion. Skeleton: No evidence of acute abnormality on limited assessment. Other neck: No evidence of acute abnormality on limited assessment. Upper chest: Visualized lung apices are clear. Review of the MIP images confirms the above findings CTA HEAD FINDINGS Anterior circulation: Bilateral intracranial ICAs, MCAs, and ACAs are patent without proximal hemodynamically significant stenosis. Posterior circulation: Bilateral intradural vertebral arteries, basilar artery and bilateral posterior cerebral arteries are patent without proximal hemodynamically significant stenosis. Venous sinuses: Not well assessed on this study. Review of the MIP images confirms the above findings IMPRESSION: No emergent large vessel occlusion or proximal hemodynamically significant stenosis. Electronically Signed   By: Gilmore GORMAN Molt M.D.   On: 03/25/2024 02:15   MR BRAIN WO CONTRAST Result Date: 03/25/2024 CLINICAL DATA:  Neuro deficit, acute, stroke suspected EXAM: MRI HEAD WITHOUT CONTRAST TECHNIQUE: Multiplanar, multiecho pulse sequences of the brain and surrounding structures were obtained without intravenous contrast. COMPARISON:  CT head from earlier today.  MRI head June 22, 2023. FINDINGS: Brain: Similar advanced scattered no acute infarction, hemorrhage, hydrocephalus, extra-axial collection or mass lesion. T2/FLAIR hyperintensities in the white matter, which are nonspecific but compatible with chronic microvascular ischemic change. Vascular: Major arterial flow voids are maintained at the skull base. Skull and upper cervical spine: Normal marrow signal.  Sinuses/Orbits: Air-fluid levels within the left sphenoid sinus. No acute orbital findings. Other: No mastoid effusions. IMPRESSION: No evidence of acute intracranial abnormality. Electronically Signed   By: Gilmore GORMAN Molt M.D.   On: 03/25/2024 00:02   CT Head Wo Contrast Result Date: 03/24/2024 CLINICAL DATA:  Dull headache and blurred vision. EXAM: CT HEAD WITHOUT CONTRAST TECHNIQUE: Contiguous axial images were obtained from the base of the skull through the vertex without intravenous contrast. RADIATION DOSE REDUCTION: This exam was performed according to the departmental dose-optimization program which includes automated exposure control, adjustment of the mA and/or kV according to patient size and/or use of iterative reconstruction technique. COMPARISON:  MRI head 06/22/2023 FINDINGS: Brain: No intracranial hemorrhage, mass effect, or evidence of acute infarct. No hydrocephalus. No extra-axial fluid collection. Age-commensurate cerebral atrophy and chronic small vessel ischemic disease. Vascular: No hyperdense vessel. Intracranial arterial calcification. Skull: No fracture or focal lesion. Sinuses/Orbits: Mucosal thickening with air-fluid level in the left sphenoid sinus. Mastoid air cells are well aerated. Globes are intact. Other: None. IMPRESSION: 1. No acute intracranial abnormality. 2. Mucosal thickening with air-fluid level in the left sphenoid sinus. Correlate for acute sinusitis. Electronically Signed   By: Norman Gatlin M.D.   On: 03/24/2024 21:30    Microbiology: Results for orders placed or performed during the hospital encounter of 03/09/24  Urine Culture     Status: Abnormal   Collection Time: 03/09/24  7:40 PM   Specimen: Urine, Clean Catch  Result Value Ref Range Status   Specimen Description URINE, CLEAN CATCH  Final   Special Requests   Final    NONE Performed at [facility], [address], [place], [place] [address]    Culture >=100,000 COLONIES/mL ESCHERICHIA  COLI (A)  Final   Report Status 03/12/2024 FINAL  Final   Organism ID, Bacteria ESCHERICHIA COLI (A)  Final      Susceptibility   Escherichia coli - MIC*    AMPICILLIN <=2 SENSITIVE Sensitive     CEFAZOLIN <=4 SENSITIVE Sensitive     CEFEPIME <=0.12 SENSITIVE Sensitive     CEFTRIAXONE  <=0.25 SENSITIVE Sensitive     CIPROFLOXACIN <=0.25 SENSITIVE Sensitive     GENTAMICIN <=1 SENSITIVE Sensitive  IMIPENEM <=0.25 SENSITIVE Sensitive     NITROFURANTOIN <=16 SENSITIVE Sensitive     TRIMETH/SULFA <=20 SENSITIVE Sensitive     AMPICILLIN/SULBACTAM <=2 SENSITIVE Sensitive     PIP/TAZO <=4 SENSITIVE Sensitive ug/mL    * >=100,000 COLONIES/mL ESCHERICHIA COLI    Labs: CBC: Recent Labs  Lab 03/24/24 2101 03/25/24 0402  WBC 6.4 4.8  HGB 13.0 11.4*  HCT 39.3 34.5*  MCV 92.3 92.7  PLT 183 147*   Basic Metabolic Panel: Recent Labs  Lab 03/24/24 2101 03/25/24 0402  NA 140 137  K 3.8 3.8  CL 101 102  CO2 28 27  GLUCOSE 113* 92  BUN 23 22  CREATININE 1.51* 0.94  CALCIUM  9.6 8.8*   Liver Function Tests: Recent Labs  Lab 03/24/24 2101  AST 23  ALT 16  ALKPHOS 43  BILITOT 0.6  PROT 7.1  ALBUMIN 4.4   CBG: No results for input(s): GLUCAP in the last 168 hours.  Discharge time spent: greater than 30 minutes.  This record has been created using Conservation officer, historic buildings. Errors have been sought and corrected,but may not always be located. Such creation errors do not reflect on the standard of care.   Signed: Amaryllis Dare, MD [facility] 03/25/2024

## 2024-03-25 NOTE — Progress Notes (Signed)
 PT Screen Note  Patient Details Name: Leslie Lewis MRN: [id number] DOB: 28-Sep-1945   Cancelled Treatment:    Reason Eval/Treat Not Completed: PT screened, no needs identified, will sign off Spoke with pt who reports she has been up and walking, moving well, no concerns, feels that she is at baseline.  Denies coordination, swallowing, visual or other issues.  Nursing corroborates that pt has been up and walking independently w/o AD.  Pt with no concerns or skilled PT needs.  Will complete PT orders at this time.    Carmin JONELLE Deed, DPT 03/25/2024, 9:51 AM

## 2024-03-25 NOTE — Hospital Course (Addendum)
 Taken from H&P.  Leslie Lewis is a 78 y.o. Caucasian female with medical history significant for paroxysmal atrial fibrillation on Eliquis , hypertension and dyslipidemia as well as breast cancer, who presented to the emergency room with acute onset of blurred vision of the left eye that lasted about 20 minutes.  Symptoms completely resolved and no other focal deficit.  On presentation elevated blood pressure at 189/70, labs with creatinine of 1.51, UA concerning for UTI with mild leukocytes and rare bacteria.  Culture was ordered. EKG with borderline sinus bradycardia at 55 and first-degree heart block. Noncontrast CT head with no acute intracranial abnormality. MRI brain also shows no acute abnormality. CTA of head and neck revealed no emergent large vessel occlusion or proximal hemodynamically significant stenosis.  Patient was admitted to complete workup for concern of TIA.  7/13: Vital stable, lipid panel with LDL of 103, otherwise normal, AKI resolved. Patient just recently completed the course of antibiotics for pansensitive E. coli in urine.  No current urinary symptoms antibiotics were discontinued.  Patient with no new deficit and her blurry vision was resolved before coming to the ED.  Neurology evaluated her and they recommended increase in Crestor to 40 mg daily.  She was on 20 mg at home.  We held her home antihypertensives today and she can resume her home medications from tomorrow.  Echocardiogram with normal EF and grade 1 diastolic dysfunction.  Mild to moderately dilated both atrium and no other significant abnormality noted. PT with no specific follow-up recommendations.  Patient is at baseline now.  Patient will resume her home medications with the change of increasing the dose of Crestor and follow-up with her providers closely for further management.

## 2024-03-25 NOTE — Assessment & Plan Note (Signed)
Will continue statin therapy and check fasting lipids.

## 2024-03-25 NOTE — ED Notes (Signed)
 Rounded on pt at this time. Pt is well appearing. RR even and unlabored. Pt is calm and cooperative. Pt denies no other needs at this time.

## 2024-03-25 NOTE — Assessment & Plan Note (Signed)
-   Will continue Synthroid.

## 2024-03-25 NOTE — Care Management Obs Status (Signed)
 MEDICARE OBSERVATION STATUS NOTIFICATION   Patient Details  Name: Leslie Lewis MRN: [id number] Date of Birth: [birth year]   Medicare Observation Status Notification Given:  No (patient did not want a copy)    [name] 03/25/2024, 2:32 PM

## 2024-03-25 NOTE — Assessment & Plan Note (Signed)
-   Will continue Eliquis  and Cardizem  CD.

## 2024-03-25 NOTE — Progress Notes (Signed)
 SLP Cancellation Note  Patient Details Name: TRUE Shackleford MRN: [id number] DOB: [birth year]   Cancelled treatment:       Reason Eval/Treat Not Completed: SLP screened, no needs identified, will sign off   Labrandon Knoch 03/25/2024, 11:35 AM

## 2024-03-25 NOTE — Assessment & Plan Note (Signed)
-   This was manifested by reversible visual changes including central visual loss and left hemianopsia that lasted about 20 minutes. - The patient will be admitted to an observation medically monitored bed.   - We will follow neuro checks q.4 hours for 24 hours.   - The patient will be placed on aspirin .   - Will obtain a 2D echo with bubble study .   - A neurology consultation  as well as physical/occupation/speech therapy consults will be obtained in a.m.SABRA   - The patient will be placed on statin therapy and fasting lipids will be checked.

## 2024-03-26 LAB — URINE CULTURE: Culture: <10000 — AB

## 2024-03-26 LAB — HEMOGLOBIN A1C
Hgb A1c MFr Bld: 5.4 % (ref 4.8–5.6)
Mean Plasma Glucose: 108 mg/dL

## 2024-07-03 ENCOUNTER — Encounter: Payer: Self-pay | Admitting: Cardiology

## 2024-07-03 ENCOUNTER — Ambulatory Visit: Attending: Cardiology | Admitting: Cardiology

## 2024-07-03 VITALS — BP 164/75 | HR 59 | Ht 68.0 in | Wt 222.0 lb

## 2024-07-03 DIAGNOSIS — I350 Nonrheumatic aortic (valve) stenosis: Secondary | ICD-10-CM | POA: Diagnosis not present

## 2024-07-03 DIAGNOSIS — I10 Essential (primary) hypertension: Secondary | ICD-10-CM | POA: Diagnosis not present

## 2024-07-03 DIAGNOSIS — D6869 Other thrombophilia: Secondary | ICD-10-CM

## 2024-07-03 DIAGNOSIS — I480 Paroxysmal atrial fibrillation: Secondary | ICD-10-CM | POA: Diagnosis not present

## 2024-07-03 NOTE — Progress Notes (Signed)
 Electrophysiology Office Note:   Date:  07/05/2024  ID:  Leslie Lewis, DOB 1946/03/20, MRN [id number]  Primary Cardiologist: None Electrophysiologist: Fonda Kitty, MD      History of Present Illness:   Leslie Lewis is a 78 y.o. female with h/o hypertension, hyperlipidemia, breast cancer, mild aortic stenosis, and atrial fibrillation who is being seen today for AF management.  Discussed the use of AI scribe software for clinical note transcription with the patient, who gave verbal consent to proceed.  History of Present Illness She has a history of atrial fibrillation, first identified during an emergency room visit in 2023 after experiencing symptoms for nine days. Initially, she felt indigestion after dinner, which resolved, but subsequently developed significant shortness of breath. After ten days, she sought emergency care and was diagnosed with atrial fibrillation. Various tests were conducted to rule out a heart attack, and she was discharged home, with the episode resolving the following day.  She remained asymptomatic until June 21, 2024, when her Apple Watch indicated she was in atrial fibrillation. During this episode, she experienced severe fatigue, shortness of breath, and sharp pain radiating from the bottom of her neck. This episode lasted eight days, resolving on June 29, 2024, as confirmed by her watch and her improved symptoms.  She is currently on Eliquis  for stroke prevention and reports no significant bleeding issues, only bruising. She is also taking diltiazem  and nebivolol .  No history of sleep apnea, snoring, or excessive daytime sleepiness.    Review of systems complete and found to be negative unless listed in HPI.   EP Information / Studies Reviewed:    EKG is ordered today. Personal review as below.  EKG Interpretation Date/Time:  Tuesday July 03 2024 13:17:29 EDT Ventricular Rate:  59 PR Interval:  208 QRS Duration:  90 QT  Interval:  436 QTC Calculation: 431 R Axis:   38  Text Interpretation: Sinus bradycardia When compared with ECG of 24-Mar-2024 22:33, No significant change was found Confirmed by Kitty Fonda [phone]) on 07/05/2024 9:24:48 PM   ECG 09/08/22: Coarse AF   Echo 03/25/24:   1. Left ventricular ejection fraction, by estimation, is 55 to 60%. The  left ventricle has normal function. The left ventricle has no regional  wall motion abnormalities. There is mild left ventricular hypertrophy.  Left ventricular diastolic parameters  are consistent with Grade I diastolic dysfunction (impaired relaxation).   2. Right ventricular systolic function is normal. The right ventricular  size is normal.   3. Left atrial size was mild to moderately dilated.   4. Right atrial size was mildly dilated.   5. The mitral valve is normal in structure. Trivial mitral valve  regurgitation.   6. The aortic valve is calcified. Aortic valve regurgitation is trivial.  Aortic valve sclerosis/calcification is present, without any evidence of  aortic stenosis.    Physical Exam:   VS:  BP (!) 164/75   Pulse (!) 59   Ht 5' 8 (1.727 m)   Wt 222 lb (100.7 kg)   SpO2 96%   BMI 33.75 kg/m    Wt Readings from Last 3 Encounters:  07/03/24 222 lb (100.7 kg)  03/24/24 237 lb (107.5 kg)  06/22/23 234 lb (106.1 kg)     GEN: Well nourished, well developed in no acute distress NECK: No JVD CARDIAC: Bradycardic, regular. 1/6 SEM RESPIRATORY:  Clear to auscultation without rales, wheezing or rhonchi  ABDOMEN: Soft, non-distended EXTREMITIES:  No edema; No deformity   ASSESSMENT AND PLAN:    #.  Paroxysmal atrial fibrillation: Symptomatic #. Hypercoagulable state due to AF -Discussed treatment options today for AF including antiarrhythmic drug therapy and ablation. Discussed risks, recovery and likelihood of success with each treatment strategy. Risk, benefits, and alternatives to EP study and ablation for afib were  discussed. These risks include but are not limited to stroke, bleeding, vascular damage, tamponade, perforation, damage to the esophagus, lungs, phrenic nerve and other structures, pulmonary vein stenosis, worsening renal function, coronary vasospasm and death.  Discussed potential need for repeat ablation procedures and antiarrhythmic drugs after an initial ablation. The patient understands these risk and wishes to proceed.  We will therefore proceed with catheter ablation at the next available time.  Carto, ICE, anesthesia are requested for the procedure.   We will obtain CT PV protocol prior to the procedure. -Continue Eliquis  5mg  BID -Continue diltiazem  240mg  daily  #. Mild AS: Appears well compensated on exam today.  - Follow up with general cardiology.  #Hypertension -Above goal today.  Recommend checking blood pressures 1-2 times per week at home and recording the values.  Recommend bringing these recordings to the primary care physician.   Follow up with Dr. Kennyth 3 months after ablation.    Signed, Fonda Kennyth, MD

## 2024-07-03 NOTE — Patient Instructions (Addendum)
 Medication Instructions:  Your physician recommends that you continue on your current medications as directed. Please refer to the Current Medication list given to you today.  *If you need a refill on your cardiac medications before your next appointment, please call your pharmacy*  Testing/Procedures: Ablation Your physician has recommended that you have an ablation. Catheter ablation is a medical procedure used to treat some cardiac arrhythmias (irregular heartbeats). During catheter ablation, a long, thin, flexible tube is put into a blood vessel in your groin (upper thigh), or neck. This tube is called an ablation catheter. It is then guided to your heart through the blood vessel. Radio frequency waves destroy small areas of heart tissue where abnormal heartbeats may cause an arrhythmia to start.   You are scheduled for Atrial Fibrillation Ablation on Wednesday, December 31 with Dr. Dr. Kennyth. Please arrive at the Main Entrance A at [facility]: [address] [place], [place] [address] at 7:30am  What To Expect:  Labs: you will need to have lab work drawn within 30 days of your procedure. Please go to any [facility] location to have these drawn - no appointment is needed. Cardiac CT Scan: this will be done about 3-4 weeks prior to your procedure. You will be contacted to schedule this test. You will receive procedure instructions either through MyChart or in the mail 4-6 week prior to your procedure.  After your procedure we recommend no driving for 4 days, no lifting over 5 lbs for 7 days, and no work or strenuous activity for 7 days.  Please contact our office at [phone] if you have any questions.    Follow-Up: We will contact you to schedule your post-procedure appointments.

## 2024-07-24 ENCOUNTER — Institutional Professional Consult (permissible substitution): Admitting: Cardiology

## 2024-08-07 ENCOUNTER — Telehealth: Payer: Self-pay

## 2024-08-07 NOTE — Telephone Encounter (Signed)
-----   Message from Nurse Carlyle C sent at 07/03/2024  2:00 PM EDT ----- Regarding: 12/31 afib ablation  Precert:  MD: Kennyth Type of ablation: A-fib Diagnosis: a-fib CPT code: A-fib (06343) Ablation scheduled (date/time): 12/31 at 10:30am  Procedure:  Added to calendar? Yes Orders entered? Yes Letter complete? No, >30 days before procedure Scheduled with cath lab? Yes Any medications to hold? No Labs ordered (CBC, BMET, PT/INR if on warfarin): Yes Mapping system: Doesn't matter CARTO/OPAL rep notified? No Cardiac CT needed? Yes, ordered Dye allergy? No Pre-meds ordered and instructions given? No, >30 days before procedure Letter method: MyChart H&P: 10/21 Device: No  Follow-up:  Cassie/Angel, please schedule Routine.  Covering RN - please send this message to [employer], EP scheduler, EP Scheduling pool, EP [employer], and CT scheduler (Brittany Lynch/Stephanie Mogg), if indicated.

## 2024-08-16 ENCOUNTER — Other Ambulatory Visit: Payer: Self-pay

## 2024-08-16 DIAGNOSIS — I480 Paroxysmal atrial fibrillation: Secondary | ICD-10-CM

## 2024-08-17 LAB — BASIC METABOLIC PANEL WITH GFR
BUN/Creatinine Ratio: 21 (ref 12–28)
BUN: 16 mg/dL (ref 8–27)
CO2: 26 mmol/L (ref 20–29)
Calcium: 9 mg/dL (ref 8.7–10.3)
Chloride: 98 mmol/L (ref 96–106)
Creatinine, Ser: 0.76 mg/dL (ref 0.57–1.00)
Glucose: 77 mg/dL (ref 70–99)
Potassium: 4.4 mmol/L (ref 3.5–5.2)
Sodium: 138 mmol/L (ref 134–144)
eGFR: 80 mL/min/1.73 (ref >59)

## 2024-08-17 LAB — CBC
Hematocrit: 38.2 % (ref 34.0–46.6)
Hemoglobin: 12.4 g/dL (ref 11.1–15.9)
MCH: 30.2 pg (ref 26.6–33.0)
MCHC: 32.5 g/dL (ref 31.5–35.7)
MCV: 93 fL (ref 79–97)
Platelets: 170 x10E3/uL (ref 150–450)
RBC: 4.1 x10E6/uL (ref 3.77–5.28)
RDW: 12.8 % (ref 11.7–15.4)
WBC: 4.7 x10E3/uL (ref 3.4–10.8)

## 2024-08-22 ENCOUNTER — Encounter (HOSPITAL_COMMUNITY): Payer: Self-pay

## 2024-08-22 ENCOUNTER — Telehealth (HOSPITAL_COMMUNITY): Payer: Self-pay

## 2024-08-22 NOTE — Telephone Encounter (Signed)
 Spoke with patient to complete pre-procedure call.     Health status review:  Any new medical conditions, recent signs of acute illness or been started on antibiotics? No Any recent hospitalizations or surgeries? No Any new medications started since pre-op visit? No  Follow all medication instructions prior to procedure or the procedure may be rescheduled:    Continue taking Eliquis  (Apixaban ) twice daily without missing any doses before procedure. Essential chronic medications:  No medication should be continued, unless told otherwise. On the morning of your procedure DO NOT take any medication., including Eliquis  (Apixaban ).  Nothing to eat or drink after midnight prior to your procedure.  Pre-procedure testing scheduled: CT on December 11 and lab work completed.  Confirmed patient is scheduled for Atrial Fibrillation Ablation on Wednesday, December 31 with Dr. Kennyth. Instructed patient to arrive at the Main Entrance A at [facility]: [address] [place], [place] [address] and check in at Admitting at 8:30 AM.  Plan to go home the same day, you will only stay overnight if medically necessary. You MUST have a responsible adult to drive you home and MUST be with you the first 24 hours after you arrive home or your procedure could be cancelled.  Informed a nurse may call a day before the procedure to confirm arrival time and ensure instructions are followed.  Patient verbalized understanding to information provided and is agreeable to proceed with procedure.   Advised to contact RN Navigator at [phone], to inform of any new medications started after call or concerns prior to procedure.

## 2024-08-22 NOTE — Telephone Encounter (Signed)
 Attempted to reach patient to discuss upcoming procedure, no answer. Left VM for patient to return call.

## 2024-08-23 ENCOUNTER — Ambulatory Visit
Admission: RE | Admit: 2024-08-23 | Discharge: 2024-08-23 | Disposition: A | Discharge status: Home / Self Care | Source: Ambulatory Visit | Attending: Cardiology | Admitting: Cardiology

## 2024-08-23 DIAGNOSIS — I480 Paroxysmal atrial fibrillation: Secondary | ICD-10-CM | POA: Insufficient documentation

## 2024-08-23 MED ORDER — IOHEXOL 350 MG/ML SOLN
100.0000 mL | Freq: Once | INTRAVENOUS | Status: AC | PRN
  Administered 2024-08-23: 100 mL via INTRAVENOUS

## 2024-09-01 ENCOUNTER — Ambulatory Visit: Payer: Self-pay | Admitting: Cardiology

## 2024-09-11 NOTE — Pre-Procedure Instructions (Signed)
 Instructed patient on the following items: Arrival time 0730 Nothing to eat or drink after midnight No meds AM of procedure Responsible person to drive you home and stay with you for 24 hrs  Have you missed any doses of anti-coagulant Eliquis - takes twice a day, hasn't missed any doses in last 4 weeks.  Don't take dose morning of procedure.

## 2024-09-12 ENCOUNTER — Ambulatory Visit (HOSPITAL_COMMUNITY)
Admission: RE | Admit: 2024-09-12 | Discharge: 2024-09-13 | Disposition: A | Discharge status: Home / Self Care | Attending: Cardiology | Admitting: Cardiology

## 2024-09-12 ENCOUNTER — Other Ambulatory Visit: Payer: Self-pay

## 2024-09-12 ENCOUNTER — Ambulatory Visit (HOSPITAL_COMMUNITY)
Admission: RE | Disposition: A | Discharge status: Home / Self Care | Payer: Self-pay | Source: Home / Self Care | Attending: Cardiology

## 2024-09-12 ENCOUNTER — Ambulatory Visit (HOSPITAL_COMMUNITY): Admitting: Anesthesiology

## 2024-09-12 DIAGNOSIS — I480 Paroxysmal atrial fibrillation: Secondary | ICD-10-CM

## 2024-09-12 DIAGNOSIS — I358 Other nonrheumatic aortic valve disorders: Secondary | ICD-10-CM | POA: Insufficient documentation

## 2024-09-12 DIAGNOSIS — I10 Essential (primary) hypertension: Secondary | ICD-10-CM

## 2024-09-12 DIAGNOSIS — I119 Hypertensive heart disease without heart failure: Secondary | ICD-10-CM | POA: Diagnosis not present

## 2024-09-12 DIAGNOSIS — E039 Hypothyroidism, unspecified: Secondary | ICD-10-CM | POA: Diagnosis not present

## 2024-09-12 DIAGNOSIS — I4891 Unspecified atrial fibrillation: Secondary | ICD-10-CM | POA: Diagnosis present

## 2024-09-12 DIAGNOSIS — Z7901 Long term (current) use of anticoagulants: Secondary | ICD-10-CM | POA: Diagnosis not present

## 2024-09-12 DIAGNOSIS — E669 Obesity, unspecified: Secondary | ICD-10-CM | POA: Diagnosis not present

## 2024-09-12 DIAGNOSIS — Z6832 Body mass index (BMI) 32.0-32.9, adult: Secondary | ICD-10-CM | POA: Insufficient documentation

## 2024-09-12 DIAGNOSIS — D6869 Other thrombophilia: Secondary | ICD-10-CM | POA: Diagnosis not present

## 2024-09-12 HISTORY — PX: ATRIAL FIBRILLATION ABLATION: EP1191

## 2024-09-12 LAB — POCT ACTIVATED CLOTTING TIME: Activated Clotting Time: 291 s

## 2024-09-12 MED ORDER — SODIUM CHLORIDE 0.9% FLUSH: 3.0000 mL | INTRAVENOUS | Status: DC | PRN

## 2024-09-12 MED ORDER — ONDANSETRON HCL 4 MG/2ML IJ SOLN: 4.0000 mg | Freq: Four times a day (QID) | INTRAMUSCULAR | Status: DC | PRN

## 2024-09-12 MED ORDER — BENAZEPRIL HCL 20 MG PO TABS
40.0000 mg | ORAL_TABLET | Freq: Every day | ORAL | Status: DC
  Administered 2024-09-13: 40 mg via ORAL
  Filled 2024-09-12: qty 2

## 2024-09-12 MED ORDER — PHENYLEPHRINE 80 MCG/ML (10ML) SYRINGE FOR IV PUSH (FOR BLOOD PRESSURE SUPPORT)
PREFILLED_SYRINGE | INTRAVENOUS | Status: DC | PRN
  Administered 2024-09-12: 80 ug via INTRAVENOUS

## 2024-09-12 MED ORDER — ACETAMINOPHEN 500 MG PO TABS
1000.0000 mg | ORAL_TABLET | Freq: Once | ORAL | Status: AC
  Administered 2024-09-12: 1000 mg via ORAL
  Filled 2024-09-12: qty 2

## 2024-09-12 MED ORDER — ACETAMINOPHEN 325 MG PO TABS: 650.0000 mg | ORAL_TABLET | ORAL | Status: DC | PRN

## 2024-09-12 MED ORDER — DILTIAZEM HCL ER COATED BEADS 240 MG PO CP24
240.0000 mg | ORAL_CAPSULE | Freq: Every day | ORAL | Status: DC
  Administered 2024-09-13: 240 mg via ORAL
  Filled 2024-09-12: qty 1

## 2024-09-12 MED ORDER — SUGAMMADEX SODIUM 200 MG/2ML IV SOLN
INTRAVENOUS | Status: DC | PRN
  Administered 2024-09-12: 200 mg via INTRAVENOUS

## 2024-09-12 MED ORDER — LEVOTHYROXINE SODIUM 75 MCG PO TABS
75.0000 ug | ORAL_TABLET | Freq: Every day | ORAL | Status: DC
  Administered 2024-09-13: 75 ug via ORAL
  Filled 2024-09-12: qty 1

## 2024-09-12 MED ORDER — SODIUM CHLORIDE 0.9% FLUSH: 3.0000 mL | Freq: Two times a day (BID) | INTRAVENOUS | Status: DC

## 2024-09-12 MED ORDER — APIXABAN 5 MG PO TABS
5.0000 mg | ORAL_TABLET | Freq: Two times a day (BID) | ORAL | Status: DC
  Administered 2024-09-12 – 2024-09-13 (×2): 5 mg via ORAL
  Filled 2024-09-12 (×2): qty 1

## 2024-09-12 MED ORDER — HEPARIN SODIUM (PORCINE) 1000 UNIT/ML IJ SOLN
INTRAMUSCULAR | Status: DC | PRN
  Administered 2024-09-12: 3000 [IU] via INTRAVENOUS
  Administered 2024-09-12: 15000 [IU] via INTRAVENOUS

## 2024-09-12 MED ORDER — FENTANYL CITRATE (PF) 100 MCG/2ML IJ SOLN
INTRAMUSCULAR | Status: AC
  Filled 2024-09-12: qty 2

## 2024-09-12 MED ORDER — SODIUM CHLORIDE 0.9 % IV SOLN: 250.0000 mL | INTRAVENOUS | Status: DC | PRN

## 2024-09-12 MED ORDER — HEPARIN SODIUM (PORCINE) 1000 UNIT/ML IJ SOLN
INTRAMUSCULAR | Status: AC
  Filled 2024-09-12: qty 20

## 2024-09-12 MED ORDER — PROTAMINE SULFATE 10 MG/ML IV SOLN
INTRAVENOUS | Status: DC | PRN
  Administered 2024-09-12: 35 mg via INTRAVENOUS

## 2024-09-12 MED ORDER — APIXABAN 5 MG PO TABS: 5.0000 mg | ORAL_TABLET | Freq: Two times a day (BID) | ORAL | Status: DC

## 2024-09-12 MED ORDER — ROCURONIUM BROMIDE 10 MG/ML (PF) SYRINGE
PREFILLED_SYRINGE | INTRAVENOUS | Status: DC | PRN
  Administered 2024-09-12: 60 mg via INTRAVENOUS
  Administered 2024-09-12: 20 mg via INTRAVENOUS

## 2024-09-12 MED ORDER — DEXAMETHASONE SOD PHOSPHATE PF 10 MG/ML IJ SOLN
INTRAMUSCULAR | Status: DC | PRN
  Administered 2024-09-12: 10 mg via INTRAVENOUS

## 2024-09-12 MED ORDER — PROPOFOL 10 MG/ML IV BOLUS
INTRAVENOUS | Status: DC | PRN
  Administered 2024-09-12: 130 mg via INTRAVENOUS

## 2024-09-12 MED ORDER — SODIUM CHLORIDE 0.9 % IV SOLN: INTRAVENOUS | Status: DC

## 2024-09-12 MED ORDER — BENAZEPRIL HCL 20 MG PO TABS
40.0000 mg | ORAL_TABLET | Freq: Once | ORAL | Status: AC
  Administered 2024-09-12: 40 mg via ORAL
  Filled 2024-09-12: qty 2

## 2024-09-12 MED ORDER — ATROPINE SULFATE 1 MG/ML IV SOLN
INTRAVENOUS | Status: DC | PRN
  Administered 2024-09-12: 1 mg via INTRAVENOUS

## 2024-09-12 MED ORDER — ATROPINE SULFATE 1 MG/10ML IJ SOSY
PREFILLED_SYRINGE | INTRAMUSCULAR | Status: AC
  Filled 2024-09-12: qty 10

## 2024-09-12 MED ORDER — HEPARIN (PORCINE) IN NACL 1000-0.9 UT/500ML-% IV SOLN
INTRAVENOUS | Status: DC | PRN
  Administered 2024-09-12 (×3): 500 mL

## 2024-09-12 MED ORDER — ATORVASTATIN CALCIUM 40 MG PO TABS
40.0000 mg | ORAL_TABLET | Freq: Every day | ORAL | Status: DC
  Administered 2024-09-12 – 2024-09-13 (×2): 40 mg via ORAL
  Filled 2024-09-12 (×2): qty 1

## 2024-09-12 MED ORDER — NEBIVOLOL HCL 10 MG PO TABS
10.0000 mg | ORAL_TABLET | Freq: Every day | ORAL | Status: DC
  Administered 2024-09-13: 10 mg via ORAL
  Filled 2024-09-12: qty 1

## 2024-09-12 MED ORDER — HYDROCHLOROTHIAZIDE 12.5 MG PO TABS
12.5000 mg | ORAL_TABLET | Freq: Every day | ORAL | Status: DC
  Administered 2024-09-13: 12.5 mg via ORAL
  Filled 2024-09-12: qty 1

## 2024-09-12 MED ORDER — TRAMADOL HCL 50 MG PO TABS
50.0000 mg | ORAL_TABLET | Freq: Once | ORAL | Status: AC
  Administered 2024-09-12: 50 mg via ORAL
  Filled 2024-09-12: qty 1

## 2024-09-12 MED ORDER — PHENYLEPHRINE HCL-NACL 20-0.9 MG/250ML-% IV SOLN
INTRAVENOUS | Status: DC | PRN
  Administered 2024-09-12: 20 ug/min via INTRAVENOUS

## 2024-09-12 MED ORDER — LIDOCAINE 2% (20 MG/ML) 5 ML SYRINGE
INTRAMUSCULAR | Status: DC | PRN
  Administered 2024-09-12: 80 mg via INTRAVENOUS

## 2024-09-12 MED ORDER — FENTANYL CITRATE (PF) 100 MCG/2ML IJ SOLN
INTRAMUSCULAR | Status: DC | PRN
  Administered 2024-09-12 (×2): 50 ug via INTRAVENOUS

## 2024-09-12 MED ORDER — ONDANSETRON HCL 4 MG/2ML IJ SOLN
INTRAMUSCULAR | Status: DC | PRN
  Administered 2024-09-12: 4 mg via INTRAVENOUS

## 2024-09-12 NOTE — Anesthesia Postprocedure Evaluation (Signed)
"   Anesthesia Post Note  Patient: Leslie Lewis  Procedure(s) Performed: ATRIAL FIBRILLATION ABLATION     Patient location during evaluation: PACU Anesthesia Type: General Level of consciousness: awake and alert Pain management: pain level controlled Vital Signs Assessment: post-procedure vital signs reviewed and stable Respiratory status: spontaneous breathing, nonlabored ventilation, respiratory function stable and patient connected to nasal cannula oxygen Cardiovascular status: blood pressure returned to baseline, stable and bradycardic Postop Assessment: no apparent nausea or vomiting Anesthetic complications: no   No notable events documented.  Last Vitals:  Vitals:   09/12/24 1500 09/12/24 1524  BP: (!) 158/68 (!) 158/68  Pulse: 63 (!) 55  Resp: 15 (!) 7  Temp:    SpO2: 94% 96%    Last Pain:  Vitals:   09/12/24 1500  TempSrc:   PainSc: 0-No pain                 Garnette [name]      "

## 2024-09-12 NOTE — Anesthesia Procedure Notes (Addendum)
 Procedure Name: Intubation Date/Time: 09/12/2024 10:20 AM  Performed by: Corinne Garnette BRAVO, MDPre-anesthesia Checklist: Patient identified, Emergency Drugs available, Suction available, Patient being monitored and Timeout performed Patient Re-evaluated:Patient Re-evaluated prior to induction Oxygen Delivery Method: Circle system utilized Preoxygenation: Pre-oxygenation with 100% oxygen Induction Type: IV induction Ventilation: Mask ventilation without difficulty Laryngoscope Size: Mac and 3 Grade View: Grade III Tube type: Oral Rae Tube size: 7.0 mm Number of attempts: 2 Placement Confirmation: ETT inserted through vocal cords under direct vision, positive ETCO2 and breath sounds checked- equal and bilateral Secured at: 22 cm Tube secured with: Tape Dental Injury: Teeth and Oropharynx as per pre-operative assessment  Difficulty Due To: Difficulty was anticipated and Difficult Airway- due to anterior larynx Future Recommendations: Recommend- induction with short-acting agent, and alternative techniques readily available Comments: Attempt x1 by CRNA with Ltanya view. Attempt x1 by MDA with Grade 3 view, ETT advanced atraumatically. CANDIE Corinne, MD

## 2024-09-12 NOTE — Progress Notes (Signed)
 Report called to receiving nurse on 6E, no additional questions endorsed at this time. Patient transported to unit on monitor via stretcher. Sites dry, intact, and soft. Care transferred.

## 2024-09-12 NOTE — H&P (Signed)
 "  Electrophysiology Note:   Date:  09/12/24  ID:  Leslie Lewis, DOB April 14, 1946, MRN [id number]   Primary Cardiologist: None Electrophysiologist: Fonda Kitty, MD       History of Present Illness:   Leslie Lewis is a 78 y.o. female with h/o hypertension, hyperlipidemia, breast cancer, mild aortic stenosis, and atrial fibrillation who is being seen today for AF management.   Discussed the use of AI scribe software for clinical note transcription with the patient, who gave verbal consent to proceed.   History of Present Illness She has a history of atrial fibrillation, first identified during an emergency room visit in 2023 after experiencing symptoms for nine days. Initially, she felt indigestion after dinner, which resolved, but subsequently developed significant shortness of breath. After ten days, she sought emergency care and was diagnosed with atrial fibrillation. Various tests were conducted to rule out a heart attack, and she was discharged home, with the episode resolving the following day.   She remained asymptomatic until June 21, 2024, when her Apple Watch indicated she was in atrial fibrillation. During this episode, she experienced severe fatigue, shortness of breath, and sharp pain radiating from the bottom of her neck. This episode lasted eight days, resolving on June 29, 2024, as confirmed by her watch and her improved symptoms.   She is currently on Eliquis  for stroke prevention and reports no significant bleeding issues, only bruising. She is also taking diltiazem  and nebivolol .   No history of sleep apnea, snoring, or excessive daytime sleepiness.    Interval: Patient presents for planned ablation. Reports feeling relatively well. No new or acute complaints.   Review of systems complete and found to be negative unless listed in HPI.    EP Information / Studies Reviewed:      EKG Interpretation Date/Time:                  Tuesday July 03 2024 13:17:29  EDT Ventricular Rate:         59 PR Interval:                 208 QRS Duration:             90 QT Interval:                 436 QTC Calculation:431 R Axis:                         38   Text Interpretation:Sinus bradycardia When compared with ECG of 24-Mar-2024 22:33, No significant change was found Confirmed by Kitty Fonda [phone] on 07/05/2024 9:24:48 PM    ECG 09/08/22: Coarse AF    Echo 03/25/24:   1. Left ventricular ejection fraction, by estimation, is 55 to 60%. The  left ventricle has normal function. The left ventricle has no regional  wall motion abnormalities. There is mild left ventricular hypertrophy.  Left ventricular diastolic parameters  are consistent with Grade I diastolic dysfunction (impaired relaxation).   2. Right ventricular systolic function is normal. The right ventricular  size is normal.   3. Left atrial size was mild to moderately dilated.   4. Right atrial size was mildly dilated.   5. The mitral valve is normal in structure. Trivial mitral valve  regurgitation.   6. The aortic valve is calcified. Aortic valve regurgitation is trivial.  Aortic valve sclerosis/calcification is present, without any evidence of  aortic stenosis.      Physical Exam:  Today's Vitals   09/12/24 0727  BP: (!) 173/67  Pulse: (!) 57  Resp: 18  Temp: 97.9 F (36.6 C)  TempSrc: Oral  SpO2: 96%  Weight: 98.4 kg  Height: 5' 8 (1.727 m)   Body mass index is 32.99 kg/m.   GEN: Well nourished, well developed in no acute distress NECK: No JVD CARDIAC: Bradycardic, regular. 1/6 SEM RESPIRATORY:  Clear to auscultation without rales, wheezing or rhonchi  ABDOMEN: Soft, non-distended EXTREMITIES:  No edema; No deformity    ASSESSMENT AND PLAN:     #. Paroxysmal atrial fibrillation: Symptomatic #. Hypercoagulable state due to AF -Discussed treatment options today for AF including antiarrhythmic drug therapy and ablation. Discussed risks, recovery and likelihood of  success with each treatment strategy. Risk, benefits, and alternatives to EP study and ablation for afib were discussed. These risks include but are not limited to stroke, bleeding, vascular damage, tamponade, perforation, damage to the esophagus, lungs, phrenic nerve and other structures, pulmonary vein stenosis, worsening renal function, coronary vasospasm and death.  Discussed potential need for repeat ablation procedures and antiarrhythmic drugs after an initial ablation. The patient understands these risk and wishes to proceed today. -Continue Eliquis  5mg  BID -Continue diltiazem  240mg  daily   #. Mild AS: Appears well compensated on exam today.  - Follow up with general cardiology.   Follow up with Dr. Kennyth 3 months after ablation.     Signed, Fonda Kennyth, MD      "

## 2024-09-12 NOTE — Anesthesia Preprocedure Evaluation (Addendum)
"                                    Anesthesia Evaluation  Patient identified by MRN, date of birth, ID band Patient awake    Reviewed: Allergy & Precautions, NPO status , Patient's Chart, lab work & pertinent test results, reviewed documented beta blocker date and time   Airway Mallampati: III  TM Distance: >3 FB Neck ROM: Full    Dental  (+) Teeth Intact, Dental Advisory Given   Pulmonary neg pulmonary ROS   Pulmonary exam normal breath sounds clear to auscultation       Cardiovascular hypertension, Pt. on medications and Pt. on home beta blockers (-) angina (-) Past MI + dysrhythmias Atrial Fibrillation  Rhythm:Regular Rate:Normal     Neuro/Psych  Headaches, neg Seizures TIA negative psych ROS   GI/Hepatic negative GI ROS, Neg liver ROS,,,  Endo/Other  Hypothyroidism  Obesity   Renal/GU negative Renal ROS     Musculoskeletal negative musculoskeletal ROS (+)    Abdominal   Peds  Hematology  (+) Blood dyscrasia (Eliquis )   Anesthesia Other Findings Day of surgery medications reviewed with the patient.  H/o breast cancer   Reproductive/Obstetrics                              Anesthesia Physical Anesthesia Plan  ASA: 3  Anesthesia Plan: General   Post-op Pain Management: Tylenol  PO (pre-op)*   Induction: Intravenous  PONV Risk Score and Plan: 3 and Dexamethasone, Ondansetron  and Treatment may vary due to age or medical condition  Airway Management Planned: Oral ETT  Additional Equipment:   Intra-op Plan:   Post-operative Plan: Extubation in OR  Informed Consent: I have reviewed the patients History and Physical, chart, labs and discussed the procedure including the risks, benefits and alternatives for the proposed anesthesia with the patient or authorized representative who has indicated his/her understanding and acceptance.     Dental advisory given  Plan Discussed with: CRNA  Anesthesia Plan Comments:           Anesthesia Quick Evaluation  "

## 2024-09-12 NOTE — Discharge Instructions (Addendum)

## 2024-09-12 NOTE — Transfer of Care (Signed)
 Immediate Anesthesia Transfer of Care Note  Patient: Leslie Lewis  Procedure(s) Performed: ATRIAL FIBRILLATION ABLATION  Patient Location: PACU and Cath Lab  Anesthesia Type:General  Level of Consciousness: awake, alert , and oriented  Airway & Oxygen Therapy: Patient Spontanous Breathing and Patient connected to nasal cannula oxygen  Post-op Assessment: Report given to RN and Post -op Vital signs reviewed and stable  Post vital signs: Reviewed and stable  Last Vitals:  Vitals Value Taken Time  BP 182/78 09/12/24 12:12  Temp    Pulse 64 09/12/24 12:14  Resp 15 09/12/24 12:14  SpO2 96 % 09/12/24 12:14  Vitals shown include unfiled device data.  Last Pain:  Vitals:   09/12/24 0727  TempSrc: Oral         Complications: No notable events documented.

## 2024-09-13 ENCOUNTER — Encounter (HOSPITAL_COMMUNITY): Payer: Self-pay | Admitting: Cardiology

## 2024-09-13 DIAGNOSIS — E039 Hypothyroidism, unspecified: Secondary | ICD-10-CM | POA: Diagnosis not present

## 2024-09-13 DIAGNOSIS — D6869 Other thrombophilia: Secondary | ICD-10-CM | POA: Diagnosis not present

## 2024-09-13 DIAGNOSIS — Z6832 Body mass index (BMI) 32.0-32.9, adult: Secondary | ICD-10-CM | POA: Diagnosis not present

## 2024-09-13 DIAGNOSIS — I358 Other nonrheumatic aortic valve disorders: Secondary | ICD-10-CM | POA: Diagnosis not present

## 2024-09-13 DIAGNOSIS — I119 Hypertensive heart disease without heart failure: Secondary | ICD-10-CM | POA: Diagnosis not present

## 2024-09-13 DIAGNOSIS — Z7901 Long term (current) use of anticoagulants: Secondary | ICD-10-CM | POA: Diagnosis not present

## 2024-09-13 DIAGNOSIS — I480 Paroxysmal atrial fibrillation: Secondary | ICD-10-CM | POA: Diagnosis not present

## 2024-09-13 DIAGNOSIS — E669 Obesity, unspecified: Secondary | ICD-10-CM | POA: Diagnosis not present

## 2024-09-13 NOTE — Discharge Summary (Signed)
 " Physician Discharge Summary   Patient: Leslie Lewis MRN: [id number] DOB: [birth year]  Admit date:     09/12/2024  Discharge date: 09/13/2024  Discharge Physician: Fonda Kitty   PCP: Sherial Bail, MD    Discharge Diagnoses: Principal Problem:   Atrial fibrillation [facility])  Resolved Problems:   * No resolved hospital problems. [facility] Course: Leslie Lewis is a 79 year old female past medical history notable for paroxysmal atrial fibrillation who presented on 12/31 for planned catheter ablation.  She underwent successful pulmonary vein isolation.  She was kept overnight for routine monitoring.  There were no acute overnight events.  She reported feeling relatively well on the morning of discharge.  Left groin stitch was removed and patient ambulated without issue or bleeding.  She was discharged home with follow-up arranged.  Assessment and Plan: #. Paroxysmal atrial fibrillation: Symptomatic.  Now status post PVI. #. Hypercoagulable state due to AF -Continue Eliquis  5mg  BID -Continue diltiazem  240mg  daily. - ollow-up in AF clinic in 1 month.  Follow-up with EP team in 3 months.   #. Mild AS: Appears well compensated on exam today.  - Follow up with general cardiology.       Procedures performed:  AF Ablation  CONCLUSIONS: 1. Successful PVI. 2. Intracardiac echo reveals normal LV size and function, trivial pericardial effusion. 3. No early apparent complications. 4. Resume Eliquis  in recovery area.   Disposition: Home  Diet recommendation:  Cardiac diet  DISCHARGE MEDICATION: Allergies as of 09/13/2024       Reactions   Trazodone         Medication List     TAKE these medications    apixaban  5 MG Tabs tablet Commonly known as: ELIQUIS  Take 1 tablet (5 mg total) by mouth 2 (two) times daily.   atorvastatin  40 MG tablet Commonly known as: LIPITOR Take 1 tablet (40 mg total) by mouth daily.   benazepril  40 MG tablet Commonly known as:  LOTENSIN  Take 40 mg by mouth daily.   CALCIUM  CITRATE PO Take 2,400 mg of elemental calcium  by mouth daily. 1200 mg each   diltiazem  240 MG 24 hr capsule Commonly known as: CARDIZEM  CD Take 240 mg by mouth daily.   exemestane  25 MG tablet Commonly known as: AROMASIN  Take 25 mg by mouth daily.   furosemide  20 MG tablet Commonly known as: LASIX  TAKE 1 TABLET BY MOUTH DAILY AS NEEDED FOR EDEMA   hydrochlorothiazide  12.5 MG tablet Commonly known as: HYDRODIURIL  Take 12.5 mg by mouth daily.   levothyroxine  75 MCG tablet Commonly known as: SYNTHROID  TAKE ONE TABLET BY MOUTH EVERY MORNING. TAKE ON AN EMPTY STOMACH WITH A GLASS OF WATER AT LEAST 30-60 MINUTES BEFORE BREAKFAST.   meclizine  12.5 MG tablet Commonly known as: ANTIVERT  Take 1 tablet (12.5 mg total) by mouth 3 (three) times daily as needed for dizziness or nausea.   nebivolol  10 MG tablet Commonly known as: BYSTOLIC  Take 10 mg by mouth daily.   nystatin powder Commonly known as: MYCOSTATIN/NYSTOP Apply 1 Application topically 2 (two) times daily as needed (Heat rash).   Vitamin D (Cholecalciferol ) 25 MCG (1000 UT) Tabs Take 1,000 Units by mouth daily.         Discharge Exam: Filed Weights   09/12/24 0727  Weight: 98.4 kg   General: Well developed, in no acute distress.  Neck: No JVD.  Cardiac: Normal rate, regular rhythm.  Resp: Normal work of breathing.  Ext: No edema. Groins soft bilaterally without bleeding  or hematoma. Neuro: No gross focal deficits.  Psych: Normal affect.   Condition at discharge: fair  The results of significant diagnostics from this hospitalization (including imaging, microbiology, ancillary and laboratory) are listed below for reference.   Imaging Studies: EP STUDY Result Date: 09/12/2024 CONCLUSIONS: 1. Successful PVI. 2. Intracardiac echo reveals normal LV size and function, trivial pericardial effusion. 3. No early apparent complications. 4. Resume Eliquis  in recovery  area. Fonda Kitty, MD, [facility], [facility] Cardiac Electrophysiology   CT CARDIAC MORPH/PULM VEIN W/CM&W/O CA SCORE Addendum Date: 09/05/2024 ADDENDUM REPORT: 09/05/2024 21:17 EXAM: OVER-READ INTERPRETATION  CT CHEST The following report is an over-read performed by radiologist Dr. Andrea Gasman of [facility] on 09/05/2024. This over-read does not include interpretation of cardiac or coronary anatomy or pathology. The coronary CTA interpretation by the cardiologist is attached. COMPARISON:  None. FINDINGS: Vascular: Aortic atherosclerosis. The included aorta is normal in caliber. Mediastinum/nodes: Calcified mediastinal and right hilar lymph nodes consistent with prior granulomatous disease. Small hiatal hernia. Lungs: No focal airspace disease. Calcified granuloma in the right lower lobe. 4 mm subpleural left lower lobe noncalcified nodule, series 10, image 61. No pleural fluid. The included airways are patent. Upper abdomen: No acute findings. Posterior gastric diverticulum. 14 mm with peripheral lobular enhancement in the right lobe of the liver, series 9, image 75, only partially included in the field of view but typical of a hemangioma. Musculoskeletal: There are no acute or suspicious osseous abnormalities. IMPRESSION: 1. Sequela of prior granulomatous disease with calcified mediastinal and right hilar lymph nodes. 2. A 4 mm subpleural left lower lobe noncalcified nodule. Per Fleischner Society Guidelines, no routine follow-up imaging is recommended. These guidelines do not apply to immunocompromised patients and patients with cancer. Follow up in patients with significant comorbidities as clinically warranted. For lung cancer screening, adhere to Lung-RADS guidelines. Reference: Radiology. 2017; 284(1):228-43. 3. Small hiatal hernia. 4. A 14 mm with peripheral lobular enhancement in the right lobe of the liver, only partially included in the field of view but typical of a hemangioma. Aortic  Atherosclerosis (ICD10-I70.0). Electronically Signed   By: Andrea Gasman M.D.   On: 09/05/2024 21:17   Result Date: 09/05/2024 CLINICAL DATA:  Atrial fibrillation scheduled for an ablation. EXAM: Cardiac CT/CTA TECHNIQUE: The patient was scanned on a Siemens Somatom scanner. FINDINGS: A 120 kV prospective scan was triggered in the descending thoracic aorta at 111 HU's. Gantry rotation speed was 280 msecs and collimation was .9 mm. No beta blockade and no NTG was given. The 3D data set was reconstructed in 5% intervals of the 60-80 % of the R-R cycle. Diastolic phases were analyzed on a dedicated work station using MPR, MIP and VRT modes. The patient received 100 cc of contrast. There is normal pulmonary vein drainage into the left atrium (2 on the right and 2 on the left) with ostial measurements as follows: RUPV: 20 x 17 mm, Area 24 mm2 RLPV: 21 x 16 mm, Area 25 mm2 LUPV: 19 x 16 mm, Area 23 mm2 LLPV: 19 x 14 mm, Area 20 mm2 The left atrial appendage is a Windsock type with ostial size 25 x 22 mm and length 31 mm, Area 42 mm2. There is no thrombus in the left atrial appendage. The esophagus runs in the left atrial midline and is not in the proximity to any of the pulmonary veins. Aorta:  Normal caliber.  No dissection or calcifications. Aortic Valve:  Trileaflet. Mild calcifications. Coronary Arteries: Normal coronary origin. Right  dominance. The study was performed without use of NTG and insufficient for plaque evaluation. IMPRESSION: 1. There is normal pulmonary vein drainage into the left atrium. (2 on the right and 2 on the left) with ostial measurements as above. 2. The left atrial appendage is a Windsock type with ostial size 25 x 22 mm and length 31 mm, Area 42 mm2. There is no thrombus in the left atrial appendage. 3. The esophagus runs in the left atrial midline and is not in the proximity to any of the pulmonary veins. 4. Coronary calcium  score 1116 (LAD, LCx, RCA). This was 93rd percentile for age,  gender. Electronically Signed: By: Redell Cave M.D. On: 08/23/2024 15:00    Microbiology: Results for orders placed or performed during the hospital encounter of 03/24/24  Urine Culture     Status: Abnormal   Collection Time: 03/24/24 10:07 PM   Specimen: Urine, Clean Catch  Result Value Ref Range Status   Specimen Description   Final    URINE, CLEAN CATCH Performed at [facility], [address], [place], [place] [address]    Special Requests   Final    NONE Performed at [facility], [address], [place], [place] [address]    Culture (A)  Final    <10,000 COLONIES/mL INSIGNIFICANT GROWTH Performed at [facility], [address] [place], [place] [address]    Report Status 03/26/2024 FINAL  Final    Labs: CBC: No results for input(s): WBC, NEUTROABS, HGB, HCT, MCV, PLT in the last 168 hours. Basic Metabolic Panel: No results for input(s): NA, K, CL, CO2, GLUCOSE, BUN, CREATININE, CALCIUM , MG, PHOS in the last 168 hours. Liver Function Tests: No results for input(s): AST, ALT, ALKPHOS, BILITOT, PROT, ALBUMIN in the last 168 hours. CBG: No results for input(s): GLUCAP in the last 168 hours.  Discharge time spent: less than 30 minutes.  Signed: Fonda Kitty, MD, [facility], [facility] Cardiac Electrophysiology          "

## 2024-09-13 NOTE — Plan of Care (Signed)
  Problem: Education: Goal: Understanding of disease, treatment, and recovery process will improve Outcome: Progressing   Problem: Activity: Goal: Ability to return to baseline activity level will improve Outcome: Progressing   Problem: Cardiac: Goal: Ability to maintain adequate cardiovascular perfusion will improve Outcome: Progressing Goal: Vascular access site(s) Level 0-1 will be maintained Outcome: Progressing   Problem: Health Behavior/ Discharge Planning: Goal: Ability to safely manage health related needs after discharge Outcome: Progressing   Problem: Education: Goal: Knowledge of General Education information will improve Description: Including pain rating scale, medication(s)/side effects and non-pharmacologic comfort measures Outcome: Progressing   Problem: Health Behavior/Discharge Planning: Goal: Ability to manage health-related needs will improve Outcome: Progressing   Problem: Clinical Measurements: Goal: Ability to maintain clinical measurements within normal limits will improve Outcome: Progressing Goal: Will remain free from infection Outcome: Progressing Goal: Diagnostic test results will improve Outcome: Progressing Goal: Respiratory complications will improve Outcome: Progressing Goal: Cardiovascular complication will be avoided Outcome: Progressing   Problem: Activity: Goal: Risk for activity intolerance will decrease Outcome: Progressing   Problem: Nutrition: Goal: Adequate nutrition will be maintained Outcome: Progressing   Problem: Coping: Goal: Level of anxiety will decrease Outcome: Progressing   Problem: Elimination: Goal: Will not experience complications related to bowel motility Outcome: Progressing Goal: Will not experience complications related to urinary retention Outcome: Progressing   Problem: Pain Managment: Goal: General experience of comfort will improve and/or be controlled Outcome: Progressing   Problem:  Safety: Goal: Ability to remain free from injury will improve Outcome: Progressing   Problem: Skin Integrity: Goal: Risk for impaired skin integrity will decrease Outcome: Progressing

## 2024-09-18 MED FILL — Atropine Sulfate Soln Prefill Syr 1 MG/10ML (0.1 MG/ML): INTRAMUSCULAR | Qty: 10 | Status: AC

## 2024-10-09 NOTE — Progress Notes (Unsigned)
 "     Electrophysiology Clinic Note    Date:  10/10/2024  Patient ID:  [name], [name] [birth year], MRN [id number] PCP:  Sherial Bail, MD  Cardiologist:  None  Electrophysiologist:  Fonda Kitty, MD  Electrophysiology APP:  Sullivan Jacuinde, NP    Discussed the use of AI scribe software for clinical note transcription with the patient, who gave verbal consent to proceed.   Patient Profile    Chief Complaint: AF ablation follow-up  History of Present Illness: Leslie Lewis is a 79 y.o. female with PMH notable for parox AFib, coronary calcium , HTN, HLD, breast Ca; seen today for Fonda Kitty, MD for routine electrophysiology follow-up s/p Ablation.  She is s/p AF ablation w isolation of pulm veins on 09/12/2024 by Dr. Kitty.   Today, she is not aware of any AF episodes since ablation, historically was fatigued with SOB during episodes. She uses apple watch to monitor for burden.  She continues to take eliquis  BID, no bleeding concerns.  Bilateral groins are completely healed without bruising, tenderness, or swelling.   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     Arrhythmia/Device History No specialty comments available.    ROS:  Please see the history of present illness. All other systems are reviewed and otherwise negative.    Physical Exam    VS:  BP (!) 146/70 (BP Location: Right Arm, Patient Position: Sitting, Cuff Size: Large)   Pulse (!) 56   Ht 5' 8 (1.727 m)   Wt 224 lb (101.6 kg)   SpO2 98%   BMI 34.06 kg/m  BMI: Body mass index is 34.06 kg/m.           Wt Readings from Last 3 Encounters:  10/10/24 224 lb (101.6 kg)  09/12/24 217 lb (98.4 kg)  07/03/24 222 lb (100.7 kg)     GEN- The patient is well appearing, alert and oriented x 3 today.   Lungs- Clear to ausculation bilaterally, normal work of breathing.  Heart-  Regular rate and rhythm, no murmurs, rubs or gallops Extremities- No peripheral edema, warm, dry   Studies Reviewed   Previous EP, cardiology notes.    EKG is ordered. Personal review of EKG from today shows:    EKG Interpretation Date/Time:  Wednesday October 10 2024 10:29:09 EST Ventricular Rate:  56 PR Interval:  224 QRS Duration:  86 QT Interval:  424 QTC Calculation: 409 R Axis:   -18  Text Interpretation: Sinus bradycardia with 1st degree A-V block Confirmed by Aparna Vanderweele [phone]) on 10/10/2024 10:30:39 AM    Cardiac CT, 08/23/2024 1. There is normal pulmonary vein drainage into the left atrium. (2 on the right and 2 on the left) with ostial measurements as above.  2. The left atrial appendage is a Windsock type with ostial size 25 x 22 mm and length 31 mm, Area 42 mm2. There is no thrombus in the left atrial appendage.  3. The esophagus runs in the left atrial midline and is not in the proximity to any of the pulmonary veins.  4. Coronary calcium  score 1116 (LAD, LCx, RCA). This was 93rd percentile for age, gender.  Bubble TTE, 03/25/2024  1. Left ventricular ejection fraction, by estimation, is 55 to 60%. The left ventricle has normal function. The left ventricle has no regional wall motion abnormalities. There is mild left ventricular hypertrophy. Left ventricular diastolic  parameters are consistent with Grade I diastolic dysfunction (impaired relaxation).   2. Right ventricular systolic function is normal. The right ventricular size is normal.   3. Left atrial size was mild to moderately dilated.   4. Right atrial size was mildly dilated.   5. The mitral valve is normal in structure. Trivial mitral valve regurgitation.   6. The aortic valve is calcified. Aortic valve regurgitation is trivial. Aortic valve sclerosis/calcification is present, without any evidence of aortic stenosis.   TTE, 09/28/2022 NORMAL LEFT VENTRICULAR SYSTOLIC FUNCTION   WITH MILD LVH  NORMAL RIGHT  VENTRICULAR SYSTOLIC FUNCTION  MILD VALVULAR REGURGITATION (See above)  MILD VALVULAR STENOSIS (See above)  COMPARED TO PREVIOUS ECHO (01/07/2020) - NO SIGNIFICANT CHANGE     Assessment and Plan     #) parox AFib S/p AF ablation 08/2024 by Dr. Kennyth Maintaining sinus rhythm since procedure   #) Hypercoag d/t parox afib CHA2DS2-VASc Score = at least 5 [CHF History: 0, HTN History: 1, Diabetes History: 0, Stroke History: 0, Vascular Disease History: 1, Age Score: 2, Gender Score: 1].  Therefore, the patient's annual risk of stroke is 7.2 %.    Stroke ppx - 5mg  eliquis  BID, appropriately dosed No bleeding concerns  #) HTN Slightly elevated in office I've asked her to continue monitor at home, ensuring she checks BP at least 1 hour after medications and record measurements       Current medicines are reviewed at length with the patient today.   The patient does not have concerns regarding her medicines.  The following changes were made today:  none  Labs/ tests ordered today include:  Orders Placed This Encounter  Procedures   EKG 12-Lead     Disposition: Follow up with Dr. Kennyth or EP APP in 2 months     Signed, Morrissa Shein, NP  10/10/24  3:18 PM  Electrophysiology [facility] "

## 2024-10-10 ENCOUNTER — Ambulatory Visit: Attending: Cardiology | Admitting: Cardiology

## 2024-10-10 VITALS — BP 146/70 | HR 56 | Ht 68.0 in | Wt 224.0 lb

## 2024-10-10 DIAGNOSIS — D6869 Other thrombophilia: Secondary | ICD-10-CM | POA: Diagnosis not present

## 2024-10-10 DIAGNOSIS — I480 Paroxysmal atrial fibrillation: Secondary | ICD-10-CM | POA: Diagnosis not present

## 2024-10-10 DIAGNOSIS — I10 Essential (primary) hypertension: Secondary | ICD-10-CM

## 2024-10-10 NOTE — Patient Instructions (Signed)
 Medication Instructions:  Your physician recommends that you continue on your current medications as directed. Please refer to the Current Medication list given to you today.  *If you need a refill on your cardiac medications before your next appointment, please call your pharmacy*  Lab Work: No labs ordered today    Testing/Procedures: No test ordered today   Follow-Up:  Home blood pressure checks daily. Keep a log and bring them with you to your Primary Care Physician.   At [facility], you and your health needs are our priority.  As part of our continuing mission to provide you with exceptional heart care, our providers are all part of one team.  This team includes your primary Cardiologist (physician) and Advanced Practice Providers or APPs (Physician Assistants and Nurse Practitioners) who all work together to provide you with the care you need, when you need it.  Your next appointment:   2 month(s)  Already scheduled for 12/10/2024  Provider:   Suzann Riddle, NP

## 2024-12-10 ENCOUNTER — Ambulatory Visit: Admitting: Cardiology
